# Patient Record
Sex: Female | Born: 1965 | Race: Black or African American | Hispanic: No | Marital: Married | Smoking: Never smoker
Health system: Southern US, Community
[De-identification: ages and names within clinical notes are randomized; demographics above are authoritative.]

## PROBLEM LIST (undated history)

## (undated) DIAGNOSIS — T8859XA Other complications of anesthesia, initial encounter: Secondary | ICD-10-CM

## (undated) DIAGNOSIS — E785 Hyperlipidemia, unspecified: Secondary | ICD-10-CM

## (undated) DIAGNOSIS — Z87898 Personal history of other specified conditions: Secondary | ICD-10-CM

## (undated) DIAGNOSIS — T4145XA Adverse effect of unspecified anesthetic, initial encounter: Secondary | ICD-10-CM

## (undated) DIAGNOSIS — Z9889 Other specified postprocedural states: Secondary | ICD-10-CM

## (undated) DIAGNOSIS — Z780 Asymptomatic menopausal state: Secondary | ICD-10-CM

## (undated) DIAGNOSIS — K649 Unspecified hemorrhoids: Secondary | ICD-10-CM

## (undated) DIAGNOSIS — R112 Nausea with vomiting, unspecified: Secondary | ICD-10-CM

## (undated) DIAGNOSIS — D649 Anemia, unspecified: Secondary | ICD-10-CM

## (undated) HISTORY — DX: Adverse effect of unspecified anesthetic, initial encounter: T41.45XA

## (undated) HISTORY — DX: Other specified postprocedural states: Z98.890

## (undated) HISTORY — PX: TOTAL ABDOMINAL HYSTERECTOMY: SHX209

## (undated) HISTORY — DX: Other specified postprocedural states: R11.2

## (undated) HISTORY — DX: Personal history of other specified conditions: Z87.898

## (undated) HISTORY — DX: Anemia, unspecified: D64.9

## (undated) HISTORY — DX: Unspecified hemorrhoids: K64.9

## (undated) HISTORY — DX: Asymptomatic menopausal state: Z78.0

## (undated) HISTORY — DX: Other complications of anesthesia, initial encounter: T88.59XA

## (undated) HISTORY — DX: Hyperlipidemia, unspecified: E78.5

---

## 1997-10-31 ENCOUNTER — Emergency Department (HOSPITAL_COMMUNITY): Admission: EM | Admit: 1997-10-31 | Discharge: 1997-10-31 | Payer: Self-pay | Admitting: Emergency Medicine

## 1997-10-31 ENCOUNTER — Encounter: Payer: Self-pay | Admitting: Emergency Medicine

## 1998-02-06 ENCOUNTER — Emergency Department (HOSPITAL_COMMUNITY): Admission: EM | Admit: 1998-02-06 | Discharge: 1998-02-06 | Payer: Self-pay | Admitting: Emergency Medicine

## 1998-02-06 ENCOUNTER — Encounter: Payer: Self-pay | Admitting: Emergency Medicine

## 2000-03-24 ENCOUNTER — Emergency Department (HOSPITAL_COMMUNITY): Admission: EM | Admit: 2000-03-24 | Discharge: 2000-03-24 | Payer: Self-pay | Admitting: Emergency Medicine

## 2005-11-19 ENCOUNTER — Ambulatory Visit (HOSPITAL_COMMUNITY): Admission: RE | Admit: 2005-11-19 | Discharge: 2005-11-19 | Payer: Self-pay | Admitting: Obstetrics & Gynecology

## 2006-10-18 ENCOUNTER — Emergency Department (HOSPITAL_COMMUNITY): Admission: EM | Admit: 2006-10-18 | Discharge: 2006-10-18 | Payer: Self-pay | Admitting: Emergency Medicine

## 2007-04-09 ENCOUNTER — Emergency Department (HOSPITAL_COMMUNITY): Admission: EM | Admit: 2007-04-09 | Discharge: 2007-04-09 | Payer: Self-pay | Admitting: Emergency Medicine

## 2007-05-11 ENCOUNTER — Encounter: Admission: RE | Admit: 2007-05-11 | Discharge: 2007-05-11 | Payer: Self-pay | Admitting: Obstetrics & Gynecology

## 2008-08-01 ENCOUNTER — Encounter: Admission: RE | Admit: 2008-08-01 | Discharge: 2008-08-01 | Payer: Self-pay | Admitting: Chiropractic Medicine

## 2008-08-06 ENCOUNTER — Encounter: Admission: RE | Admit: 2008-08-06 | Discharge: 2008-08-06 | Payer: Self-pay | Admitting: Obstetrics & Gynecology

## 2009-09-13 ENCOUNTER — Encounter: Admission: RE | Admit: 2009-09-13 | Discharge: 2009-09-13 | Payer: Self-pay | Admitting: Obstetrics & Gynecology

## 2010-01-26 ENCOUNTER — Encounter: Payer: Self-pay | Admitting: Obstetrics & Gynecology

## 2010-06-19 IMAGING — MG MM SCREEN MAMMOGRAM BILATERAL
4 series · 4 of 4 positions shown · non-contrast
Comparison: none

DG SCREEN MAMMOGRAM BILATERAL
Bilateral CC and MLO view(s) were taken.

DIGITAL SCREENING MAMMOGRAM WITH CAD:
The breast tissue is heterogeneously dense.  No masses or malignant type calcifications are 
identified.  Compared with prior studies.
Images were processed with CAD.

[R CC]
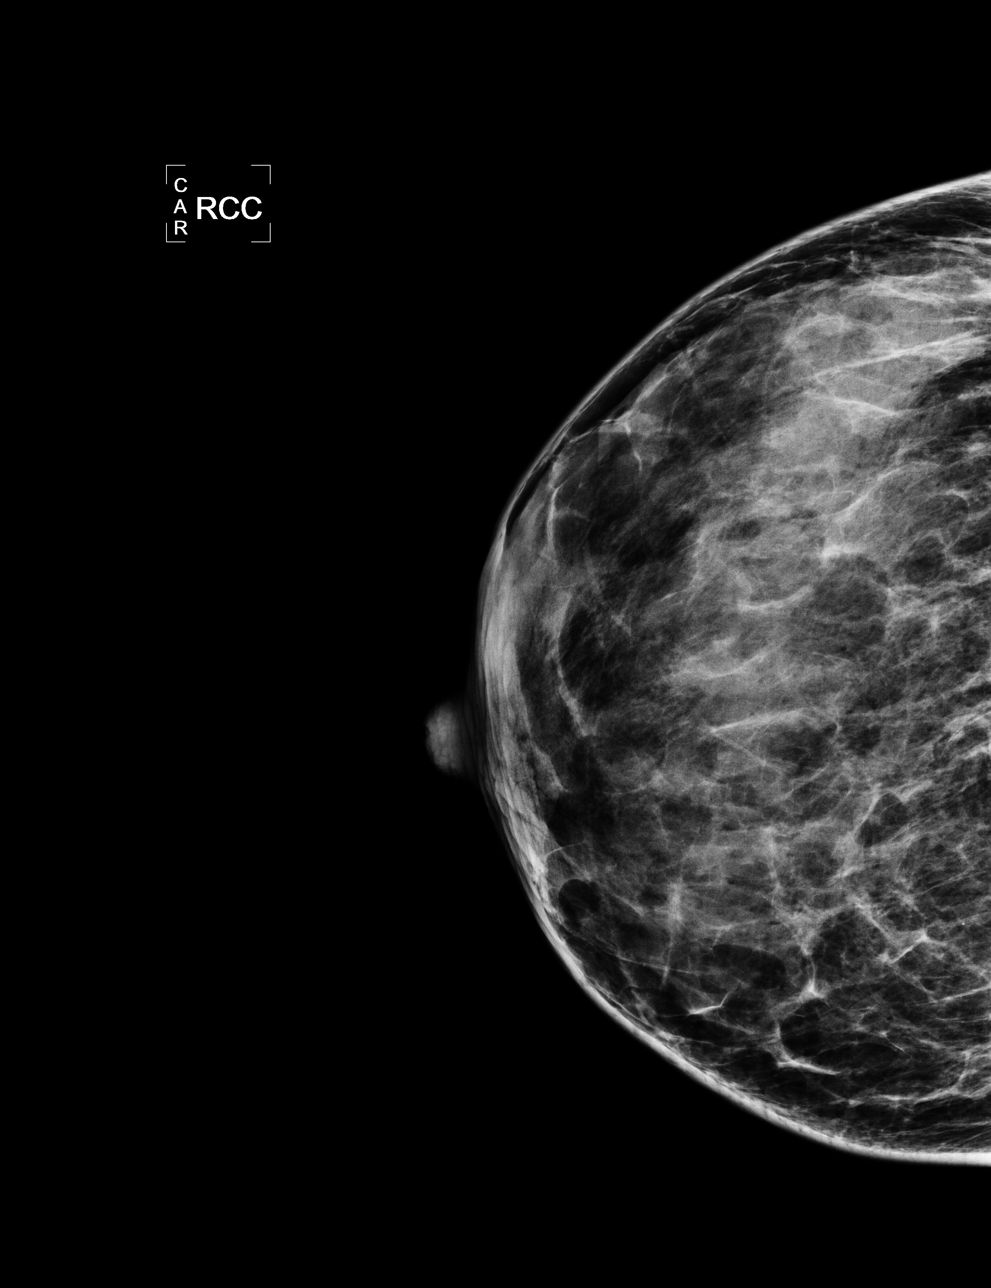

[L CC]
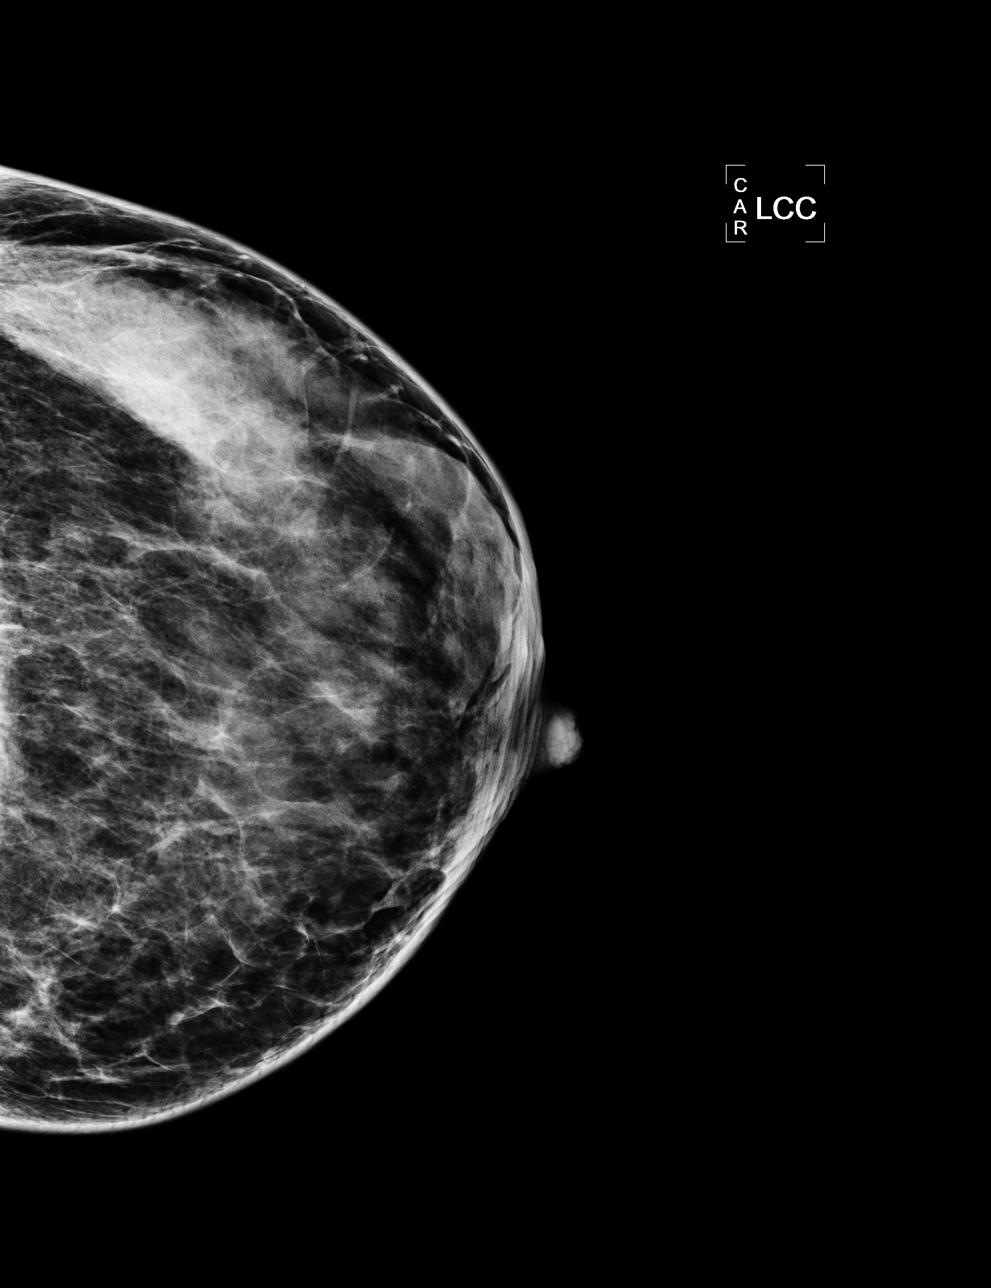

[L MLO]
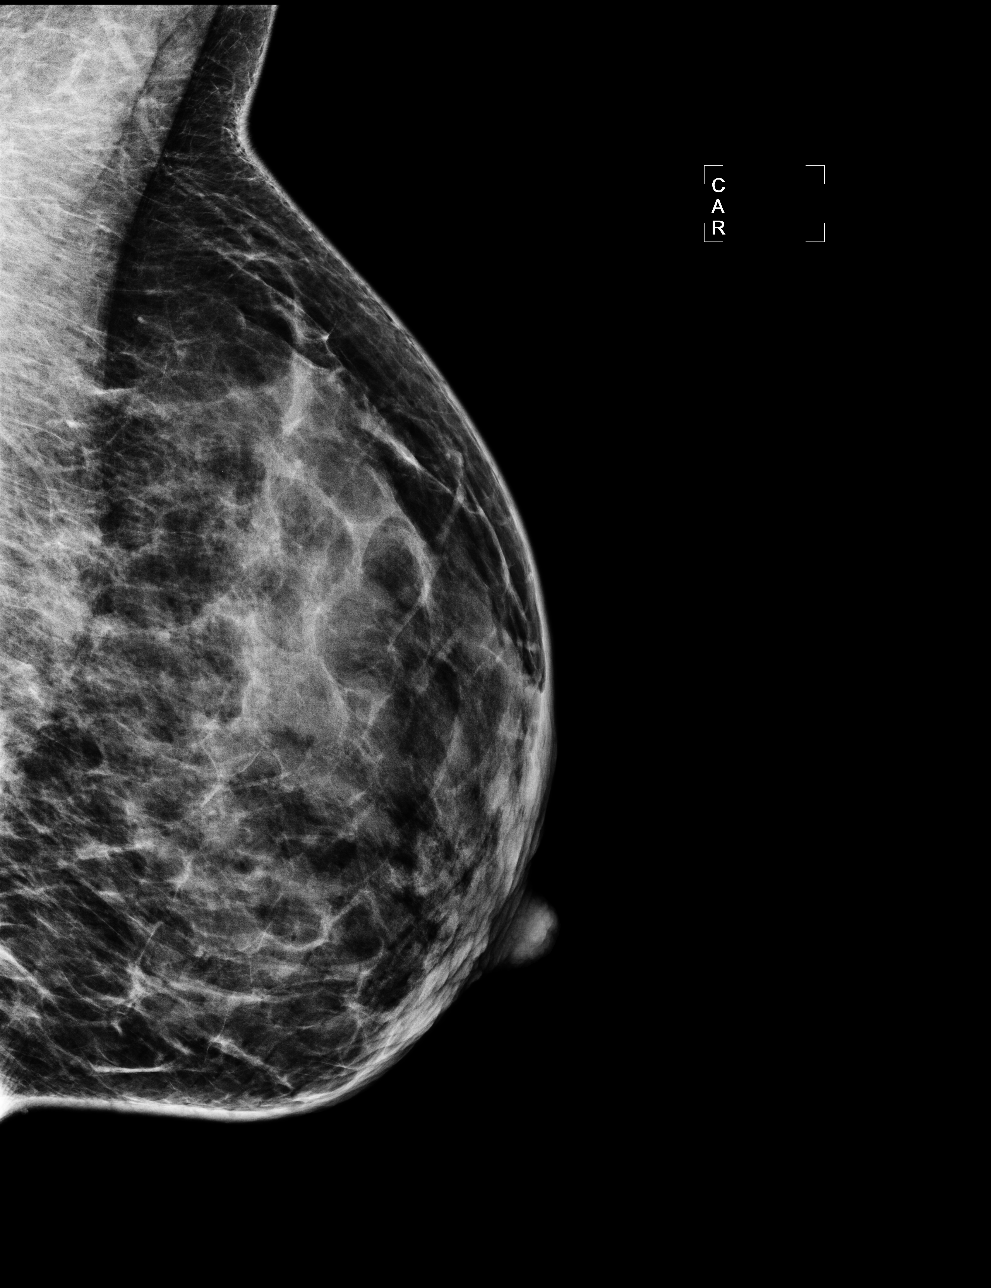

[R MLO]
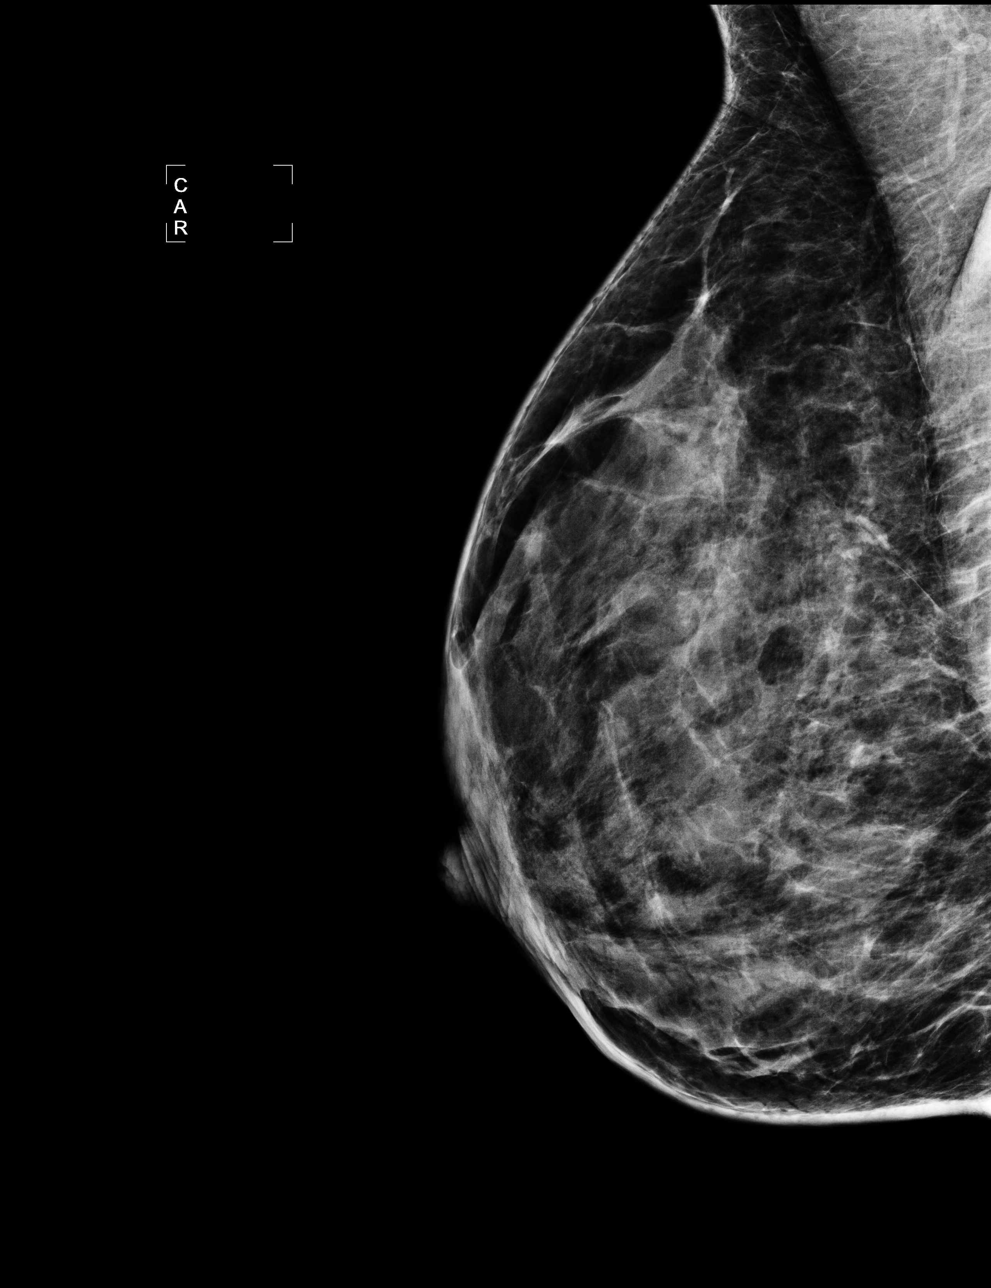

[4 of 4 positions shown; findings below may reference images not displayed]

IMPRESSION: No specific mammographic evidence of malignancy.  Next screening mammogram is recommended in one 
year.

A result letter of this screening mammogram will be mailed directly to the patient.

ASSESSMENT: Negative - BI-RADS 1

Screening mammogram in 1 year.
,

## 2010-09-30 LAB — POCT I-STAT, CHEM 8
BUN: 14
Calcium, Ion: 1.23
Chloride: 103
Glucose, Bld: 136 — ABNORMAL HIGH

## 2010-09-30 LAB — POCT CARDIAC MARKERS: Troponin i, poc: <0.05

## 2010-10-16 LAB — URINALYSIS, ROUTINE W REFLEX MICROSCOPIC
Nitrite: NEGATIVE
Specific Gravity, Urine: 1.013
pH: 7.5

## 2010-10-17 ENCOUNTER — Other Ambulatory Visit: Payer: Self-pay | Admitting: Obstetrics & Gynecology

## 2010-10-17 DIAGNOSIS — Z1231 Encounter for screening mammogram for malignant neoplasm of breast: Secondary | ICD-10-CM

## 2010-11-03 ENCOUNTER — Ambulatory Visit
Admission: RE | Admit: 2010-11-03 | Discharge: 2010-11-03 | Disposition: A | Discharge status: Home / Self Care | Payer: 59 | Source: Ambulatory Visit | Attending: Obstetrics & Gynecology | Admitting: Obstetrics & Gynecology

## 2010-11-03 DIAGNOSIS — Z1231 Encounter for screening mammogram for malignant neoplasm of breast: Secondary | ICD-10-CM

## 2011-11-19 ENCOUNTER — Other Ambulatory Visit: Payer: Self-pay | Admitting: Obstetrics & Gynecology

## 2011-11-19 DIAGNOSIS — Z1231 Encounter for screening mammogram for malignant neoplasm of breast: Secondary | ICD-10-CM

## 2012-01-01 ENCOUNTER — Ambulatory Visit
Admission: RE | Admit: 2012-01-01 | Discharge: 2012-01-01 | Disposition: A | Discharge status: Home / Self Care | Payer: 59 | Source: Ambulatory Visit | Attending: Obstetrics & Gynecology | Admitting: Obstetrics & Gynecology

## 2012-01-01 DIAGNOSIS — Z1231 Encounter for screening mammogram for malignant neoplasm of breast: Secondary | ICD-10-CM

## 2013-07-03 ENCOUNTER — Other Ambulatory Visit: Payer: Self-pay

## 2013-07-03 DIAGNOSIS — Z1231 Encounter for screening mammogram for malignant neoplasm of breast: Secondary | ICD-10-CM

## 2013-07-10 ENCOUNTER — Ambulatory Visit
Admission: RE | Admit: 2013-07-10 | Discharge: 2013-07-10 | Disposition: A | Discharge status: Home / Self Care | Payer: BC Managed Care – PPO | Source: Ambulatory Visit

## 2013-07-10 ENCOUNTER — Other Ambulatory Visit: Payer: BC Managed Care – PPO

## 2013-07-10 DIAGNOSIS — Z1231 Encounter for screening mammogram for malignant neoplasm of breast: Secondary | ICD-10-CM

## 2013-07-10 DIAGNOSIS — Z0000 Encounter for general adult medical examination without abnormal findings: Secondary | ICD-10-CM

## 2013-07-11 LAB — LIPID PANEL
CHOL/HDL RATIO: 3.1 ratio
Cholesterol: 130 mg/dL (ref 0–200)
HDL: 42 mg/dL (ref >39)
LDL CALC: 80 mg/dL (ref 0–99)
Triglycerides: 42 mg/dL (ref <150)
VLDL: 8 mg/dL (ref 0–40)

## 2013-07-11 LAB — CBC WITH DIFFERENTIAL/PLATELET
Basophils Absolute: 0 10*3/uL (ref 0.0–0.1)
Basophils Relative: 0 % (ref 0–1)
EOS ABS: 0 10*3/uL (ref 0.0–0.7)
Eosinophils Relative: 1 % (ref 0–5)
HCT: 38.1 % (ref 36.0–46.0)
HEMOGLOBIN: 13.1 g/dL (ref 12.0–15.0)
LYMPHS ABS: 1.2 10*3/uL (ref 0.7–4.0)
Lymphocytes Relative: 27 % (ref 12–46)
MCH: 28.5 pg (ref 26.0–34.0)
MCHC: 34.4 g/dL (ref 30.0–36.0)
MCV: 82.8 fL (ref 78.0–100.0)
MONOS PCT: 6 % (ref 3–12)
Monocytes Absolute: 0.3 10*3/uL (ref 0.1–1.0)
NEUTROS ABS: 3 10*3/uL (ref 1.7–7.7)
NEUTROS PCT: 66 % (ref 43–77)
PLATELETS: 171 10*3/uL (ref 150–400)
RBC: 4.6 MIL/uL (ref 3.87–5.11)
RDW: 15.1 % (ref 11.5–15.5)
WBC: 4.6 10*3/uL (ref 4.0–10.5)

## 2013-07-11 LAB — COMPLETE METABOLIC PANEL WITH GFR
ALBUMIN: 4.7 g/dL (ref 3.5–5.2)
ALK PHOS: 42 U/L (ref 39–117)
ALT: 9 U/L (ref 0–35)
AST: 13 U/L (ref 0–37)
BILIRUBIN TOTAL: 0.4 mg/dL (ref 0.2–1.2)
BUN: 9 mg/dL (ref 6–23)
CO2: 27 meq/L (ref 19–32)
Calcium: 9.3 mg/dL (ref 8.4–10.5)
Chloride: 105 mEq/L (ref 96–112)
Creat: 0.6 mg/dL (ref 0.50–1.10)
GFR, Est Non African American: >89 mL/min
GLUCOSE: 72 mg/dL (ref 70–99)
POTASSIUM: 4.1 meq/L (ref 3.5–5.3)
SODIUM: 141 meq/L (ref 135–145)
TOTAL PROTEIN: 7.3 g/dL (ref 6.0–8.3)

## 2013-07-17 ENCOUNTER — Ambulatory Visit
Admission: RE | Admit: 2013-07-17 | Discharge: 2013-07-17 | Disposition: A | Discharge status: Home / Self Care | Payer: BC Managed Care – PPO | Source: Ambulatory Visit | Attending: Physician Assistant | Admitting: Physician Assistant

## 2013-07-17 ENCOUNTER — Encounter: Payer: Self-pay | Admitting: Physician Assistant

## 2013-07-17 ENCOUNTER — Ambulatory Visit (INDEPENDENT_AMBULATORY_CARE_PROVIDER_SITE_OTHER): Payer: BC Managed Care – PPO | Admitting: Physician Assistant

## 2013-07-17 VITALS — BP 120/62 | HR 82 | Temp 97.7°F | Resp 14 | Ht 64.5 in | Wt 140.0 lb

## 2013-07-17 DIAGNOSIS — Z0000 Encounter for general adult medical examination without abnormal findings: Secondary | ICD-10-CM

## 2013-07-17 DIAGNOSIS — E559 Vitamin D deficiency, unspecified: Secondary | ICD-10-CM

## 2013-07-17 DIAGNOSIS — R1900 Intra-abdominal and pelvic swelling, mass and lump, unspecified site: Secondary | ICD-10-CM

## 2013-07-17 NOTE — Addendum Note (Signed)
Addended by: Phillips OdorSIX, CHRISTINA H on: 07/17/2013 02:56 PM   Modules accepted: Orders

## 2013-07-17 NOTE — Progress Notes (Signed)
Patient ID: Marie Wagner MRN: [id number], DOB: Jun 07, 1965, 48 y.o. Date of Encounter: 07/17/2013,   Chief Complaint: Physical (CPE)  HPI: 48 y.o. y/o AA female  here for CPE.   Her last physical exam was with me 02/05/2012.Today I have reviewed that note in her paper chart.  She says that she has noticed that her left hip area seems to be " larger than the right ".   Says she thought she should mention it is because her mom had "bone cancer". However, with further discussion she says her mom had melanoma and bone cancer--I. think that the bone cancer was probably metastasis. She states that she has had no pain in the site of her concern. With furhter discussion she actually says that she has noticed this area for many years--thinks that it's been this way since her children were born.  She works as a Chart[employer] loss adjuster. Says she's been out of school for the summer she has started to exercise. [place] park and walks/runs almost every day. Even with this exercise, she is having no pain in the left hip area.   Review of Systems: Consitutional: No fever, chills, fatigue, night sweats, lymphadenopathy. No significant/unexplained weight changes. Eyes: No visual changes, eye redness, or discharge. ENT/Mouth: No ear pain, sore throat, nasal drainage, or sinus pain. Cardiovascular: No chest pressure,heaviness, tightness or squeezing, even with exertion. No increased shortness of breath or dyspnea on exertion.No palpitations, edema, orthopnea, PND. Respiratory: No cough, hemoptysis, SOB, or wheezing. Gastrointestinal: No anorexia, dysphagia, reflux, pain, nausea, vomiting, hematemesis, diarrhea, constipation, BRBPR, or melena. Breast: No mass, nodules, bulging, or retraction. No skin changes or inflammation. No nipple discharge. No lymphadenopathy. Genitourinary: No dysuria, hematuria, incontinence, vaginal discharge, pruritis, burning, abnormal bleeding, or pain. Musculoskeletal: No  decreased ROM, No joint pain or swelling. No significant pain in neck, back, or extremities. Skin: No rash, pruritis, or concerning lesions. Neurological: No headache, dizziness, syncope, seizures, tremors, memory loss, coordination problems, or paresthesias. Psychological: No anxiety, depression, hallucinations, SI/HI. Endocrine: No polydipsia, polyphagia, polyuria, or known diabetes.No increased fatigue. No palpitations/rapid heart rate. No significant/unexplained weight change. All other systems were reviewed and are otherwise negative.  History reviewed. No pertinent past medical history.   Past Surgical History  Procedure Laterality Date  . Abdominal hysterectomy      Home Meds:  No outpatient prescriptions prior to visit.   No facility-administered medications prior to visit.    Allergies: No Known Allergies  History   Social History  . Marital Status: Married    Spouse Name: N/A    Number of Children: N/A  . Years of Education: N/A   Occupational History  . Not on file.   Social History Main Topics  . Smoking status: Never Smoker   . Smokeless tobacco: Never Used  . Alcohol Use: No  . Drug Use: No  . Sexual Activity: Yes   Other Topics Concern  . 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   She has been on summer break and started to walk/run almost every day at country [place].   Hopes to continue exercise even once the school year starts back.   Has 4 children who are "grown"    Family History  Problem Relation Age of Onset  . Cancer Mother     melanoma, "bone cancer"  . Cancer Sister 64    Breast Cancer  Physical Exam: Blood pressure 120/62, pulse 82, temperature 97.7 F (36.5 C), temperature source Oral, resp. rate 14, height 5' 4.5" (1.638 m), weight 140 lb (63.504 kg)., Body mass index is 23.67 kg/(m^2). General: Well developed, well nourished,AAF. Appears in  no acute distress. HEENT: Normocephalic, atraumatic. Conjunctiva pink, sclera non-icteric. Pupils 2 mm constricting to 1 mm, round, regular, and equally reactive to light and accomodation. EOMI. Internal auditory canal clear. TMs with good cone of light and without pathology. Nasal mucosa pink. Nares are without discharge. No sinus tenderness. Oral mucosa pink.  Pharynx without exudate.   Neck: Supple. Trachea midline. No thyromegaly. Full ROM. No lymphadenopathy.No Carotid Bruits. Lungs: Clear to auscultation bilaterally without wheezes, rales, or rhonchi. Breathing is of normal effort and unlabored. Cardiovascular: RRR with S1 S2. No murmurs, rubs, or gallops. Distal pulses 2+ symmetrically. No carotid or abdominal bruits. Breast: Per Gyn. Abdomen: Soft, non-tender, non-distended with normoactive bowel sounds. No hepatosplenomegaly or masses. No rebound/guarding. No CVA tenderness. No hernias.  Genitourinary: Per Gyn. Musculoskeletal: Full range of motion and 5/5 strength throughout. Left anterolateral thigh is more firm compared to the left, when each is palpated. However, there is no distinct mass. There is no tenderness with palpation. Extremities without  Edema. Skin: Warm and moist without erythema, ecchymosis, wounds, or rash. Neuro: A+Ox3. CN II-XII grossly intact. Moves all extremities spontaneously. Full sensation throughout. Normal gait. DTR 2+ throughout upper and lower extremities. Finger to nose intact. Psych:  Responds to questions appropriately with a normal affect.   Assessment/Plan:  48 y.o. y/o female here for CPE 1. Visit for preventive health examination  A. Screening Labs: She recently came and had screening labs on 07/10/13. All were normal and lipids were excellent.  B. Pap:per Gyn. She has a sister who had breast cancer at age 49. Patient does have annual mammograms which are followed by her gynecologist.  C. Screening Mammogram: Per Gyn  D. DEXA/BMD:  Not indicated  until age 49 90-  65  E. Colorectal Cancer Screening: Will  Discuss at age 48.  F. Immunizations:  Influenza: N/A Tetanus: Given here 01/14/2011 Pneumococcal: Not indicated until age 48 Zostavax: discuss at age 48     2. Unspecified vitamin D deficiency History a very low vitamin D level. Continue over-the-counter vitamin D 2000 units daily.  3. Mass of pelvis Discussed with her that I think that the finding on exam is just her anatomy. Also discussed that it was possible that during her pregnancies that anatomy shifted towards the left and have created this current anatomy. However I will obtain x-rays to evaluate and document. - DG Hip Bilateral W/Pelvis; Future   Signed, Shon HaleMary Beth Sea CliffDixon, [place]PA, [facility] 07/17/2013 10:58 AM

## 2014-04-18 ENCOUNTER — Encounter: Payer: Self-pay | Admitting: Family Medicine

## 2014-04-18 ENCOUNTER — Ambulatory Visit (INDEPENDENT_AMBULATORY_CARE_PROVIDER_SITE_OTHER): Payer: BC Managed Care – PPO | Admitting: Family Medicine

## 2014-04-18 VITALS — BP 118/76 | HR 78 | Temp 98.0°F | Resp 14 | Ht 65.0 in | Wt 137.0 lb

## 2014-04-18 DIAGNOSIS — R5383 Other fatigue: Secondary | ICD-10-CM

## 2014-04-18 DIAGNOSIS — E559 Vitamin D deficiency, unspecified: Secondary | ICD-10-CM | POA: Diagnosis not present

## 2014-04-18 DIAGNOSIS — J040 Acute laryngitis: Secondary | ICD-10-CM

## 2014-04-18 DIAGNOSIS — J329 Chronic sinusitis, unspecified: Secondary | ICD-10-CM

## 2014-04-18 DIAGNOSIS — R0982 Postnasal drip: Secondary | ICD-10-CM

## 2014-04-18 MED ORDER — FLUTICASONE PROPIONATE 50 MCG/ACT NA SUSP: 2.0000 | Freq: Every day | NASAL | Status: DC

## 2014-04-18 MED ORDER — METHYLPREDNISOLONE (PAK) 4 MG PO TABS: ORAL_TABLET | ORAL | Status: DC

## 2014-04-18 NOTE — Progress Notes (Signed)
Patient ID: Marie Wagner, female   DOB: Aug 15, 1965, 49 y.o.   MRN: [id number]   Subjective:    Patient ID: Marie FlemingsJudy A Wagner, female    DOB: Aug 15, 1965, 49 y.o.   MRN: [id number]  Patient presents for Had cold but hoarseness has not resolved and Fatigue   patient here with coarse voice. She had an upper respiratory infection about a month ago. She's had significant sinus drainage and postnasal drip she states she typically has some postnasal drip. She also gets mild reflux from time to time. Her symptoms of the infection cleared up and she feels fairly well besides some fatigue but she thinks this is mostly due to her job and her schedule. She sleeps well and feels rested otherwise. Her voice however has not come back and if she is still raspy and hoarse at times. She sings in the choir and this is effective she is also a Chartered loss adjusterschoolteacher. She does not have any current sore throat or sinus pressure.    Review Of Systems: per above  GEN-+ fatigue, fever, weight loss,weakness, recent illness HEENT- denies eye drainage, change in vision,+ nasal discharge, CVS- denies chest pain, palpitations RESP- denies SOB, cough, wheeze ABD- denies N/V, change in stools, abd pain GU- denies dysuria, hematuria, dribbling, incontinence MSK- denies joint pain, muscle aches, injury Neuro- denies headache, dizziness, syncope, seizure activity       Objective:    BP 118/76 mmHg  Pulse 78  Temp(Src) 98 F (36.7 C) (Oral)  Resp 14  Ht 5\' 5"  (1.651 m)  Wt 137 lb (62.143 kg)  BMI 22.80 kg/m2 GEN- NAD, alert and oriented x3 HEENT- PERRL, EOMI, non injected sclera, pink conjunctiva, MMM, , mild drainage noted post oropharynx Neck- Supple, no thyromegaly CVS- RRR, no murmur RESP-CTAB Pulses- Radial,  2+        Assessment & Plan:      Problem List Items Addressed This Visit    None    Visit Diagnoses    Other fatigue    -  Primary    Fatigue may be MTF, sleeps, well , history of Vit D def, will  recheck, also check CBC, TSH    Relevant Orders    CBC with Differential/Platelet    Comprehensive metabolic panel    TSH    Laryngitis        I think this is due to post nasal drip, I am going to give her a course of oral steroids, and nasal spray, if no improvement will send to ENT for evaluation    Vitamin D deficiency        Relevant Orders    Vitamin D, 25-hydroxy    Post-nasal drainage           Note: This dictation was prepared with Dragon dictation along with smaller phrase technology. Any transcriptional errors that result from this process are unintentional.

## 2014-04-18 NOTE — Patient Instructions (Signed)
Take steroids in morning as prescribed Use nasal spray as well for drip,  Use humidifier  Call if not improved We will call with lab results F/U as needed

## 2014-04-19 LAB — COMPREHENSIVE METABOLIC PANEL
ALT: 8 U/L (ref 0–35)
AST: 14 U/L (ref 0–37)
Albumin: 4.7 g/dL (ref 3.5–5.2)
Alkaline Phosphatase: 46 U/L (ref 39–117)
BUN: 9 mg/dL (ref 6–23)
CHLORIDE: 103 meq/L (ref 96–112)
CO2: 28 meq/L (ref 19–32)
Calcium: 9.3 mg/dL (ref 8.4–10.5)
Creat: 0.65 mg/dL (ref 0.50–1.10)
GLUCOSE: 91 mg/dL (ref 70–99)
POTASSIUM: 3.6 meq/L (ref 3.5–5.3)
Sodium: 138 mEq/L (ref 135–145)
TOTAL PROTEIN: 7.4 g/dL (ref 6.0–8.3)
Total Bilirubin: 0.4 mg/dL (ref 0.2–1.2)

## 2014-04-19 LAB — CBC WITH DIFFERENTIAL/PLATELET
BASOS PCT: 1 % (ref 0–1)
Basophils Absolute: 0.1 10*3/uL (ref 0.0–0.1)
EOS ABS: 0.1 10*3/uL (ref 0.0–0.7)
EOS PCT: 1 % (ref 0–5)
HEMATOCRIT: 38 % (ref 36.0–46.0)
HEMOGLOBIN: 12.8 g/dL (ref 12.0–15.0)
LYMPHS ABS: 1.7 10*3/uL (ref 0.7–4.0)
Lymphocytes Relative: 28 % (ref 12–46)
MCH: 27.9 pg (ref 26.0–34.0)
MCHC: 33.7 g/dL (ref 30.0–36.0)
MCV: 82.8 fL (ref 78.0–100.0)
MONO ABS: 0.4 10*3/uL (ref 0.1–1.0)
MONOS PCT: 7 % (ref 3–12)
MPV: 10.4 fL (ref 8.6–12.4)
NEUTROS PCT: 63 % (ref 43–77)
Neutro Abs: 3.7 10*3/uL (ref 1.7–7.7)
Platelets: 177 10*3/uL (ref 150–400)
RBC: 4.59 MIL/uL (ref 3.87–5.11)
RDW: 14.2 % (ref 11.5–15.5)
WBC: 5.9 10*3/uL (ref 4.0–10.5)

## 2014-04-19 LAB — TSH: TSH: 1.353 u[IU]/mL (ref 0.350–4.500)

## 2014-04-19 LAB — VITAMIN D 25 HYDROXY (VIT D DEFICIENCY, FRACTURES): VIT D 25 HYDROXY: 33 ng/mL (ref 30–100)

## 2014-04-30 ENCOUNTER — Telehealth: Payer: Self-pay | Admitting: Physician Assistant

## 2014-04-30 DIAGNOSIS — R490 Dysphonia: Secondary | ICD-10-CM

## 2014-04-30 NOTE — Telephone Encounter (Signed)
Will forward to Dr. Thiells 

## 2014-04-30 NOTE — Telephone Encounter (Signed)
[phone] Pt saw Dr Jeanice Limurham on 4/13 and was told to call back if her voice was not all the back and that she may have to be referred to a specialist and she is wanting to talk to someone about this

## 2014-04-30 NOTE — Telephone Encounter (Signed)
Please refer to ENT- for chronic hoarse voice

## 2014-04-30 NOTE — Telephone Encounter (Signed)
Call placed to patient.   Patient reports that she remains hoarse, though her voice has improved with steroid therapy.   MD please advise.

## 2014-05-01 NOTE — Telephone Encounter (Signed)
Referral order placed.   Call placed to patient and patient made aware per VM. 

## 2014-05-08 ENCOUNTER — Telehealth: Payer: Self-pay | Admitting: *Deleted

## 2014-05-08 NOTE — Telephone Encounter (Signed)
[facility] May [facility] at 1:50pm, lmtrc to pt

## 2014-05-10 NOTE — Telephone Encounter (Signed)
Called pt on cell phone lmtrc

## 2014-06-11 ENCOUNTER — Other Ambulatory Visit: Payer: Self-pay

## 2014-06-11 DIAGNOSIS — Z1231 Encounter for screening mammogram for malignant neoplasm of breast: Secondary | ICD-10-CM

## 2014-06-19 ENCOUNTER — Ambulatory Visit (INDEPENDENT_AMBULATORY_CARE_PROVIDER_SITE_OTHER): Payer: BC Managed Care – PPO | Admitting: Family Medicine

## 2014-06-19 ENCOUNTER — Encounter: Payer: Self-pay | Admitting: Family Medicine

## 2014-06-19 VITALS — BP 118/66 | HR 72 | Temp 97.9°F | Resp 14 | Ht 65.0 in | Wt 138.0 lb

## 2014-06-19 DIAGNOSIS — N644 Mastodynia: Secondary | ICD-10-CM | POA: Diagnosis not present

## 2014-06-19 DIAGNOSIS — K219 Gastro-esophageal reflux disease without esophagitis: Secondary | ICD-10-CM | POA: Diagnosis not present

## 2014-06-19 DIAGNOSIS — R0789 Other chest pain: Secondary | ICD-10-CM

## 2014-06-19 NOTE — Patient Instructions (Signed)
Try nexium once a day for symptoms Try to document what you eat before any symptoms Mammogram next month as scheduled F/U as needed

## 2014-06-20 NOTE — Progress Notes (Signed)
Patient ID: Marie Wagner, female   DOB: 1965/03/02, 49 y.o.   MRN: [id number]   Subjective:    Patient ID: Marie Wagner, female    DOB: 1965-10-16, 49 y.o.   MRN: [id number]  Patient presents for Chest Pain and Breast Sensitivity  patient here with some chest discomfort. She states that she has burning and heavy sensation when she lays down to go to sleep this is typically after eating she hasn't hasn't in the morning. His only been present for the past week. She has a lot of gurgling in her stomach and belches a lot. She took Pepto-Bismol one night that she had some non-nausea and this did help her symptoms. She denies any sharp chest pain which is radiating denies any shortness of breath or diaphoresis associated. No recent illness. She also notices a sensitivity to the left side of her left breast on and off. She has not felt any new masses or nodules she does have a mammogram scheduled for next month. This sensation will come and go and has been present for many months. She does not have a rash.    Review Of Systems:  GEN- denies fatigue, fever, weight loss,weakness, recent illness HEENT- denies eye drainage, change in vision, nasal discharge, CVS-+ chest pain, palpitations RESP- denies SOB, cough, wheeze ABD- denies N/V, change in stools, abd pain GU- denies dysuria, hematuria, dribbling, incontinence MSK- denies joint pain, muscle aches, injury Neuro- denies headache, dizziness, syncope, seizure activity       Objective:    BP 118/66 mmHg  Pulse 72  Temp(Src) 97.9 F (36.6 C) (Oral)  Resp 14  Ht 5\' 5"  (1.651 m)  Wt 138 lb (62.596 kg)  BMI 22.96 kg/m2 GEN- NAD, alert and oriented x3 HEENT- PERRL, EOMI, non injected sclera, pink conjunctiva, MMM, oropharynx clear Neck- Supple, no thyromegaly CVS- RRR, no murmur RESP-CTAB Breast- normal symmetry, no nipple inversion,no nipple drainage, no nodules or lumps felt Nodes- no axillary nodes ABD-NABS,soft,NT,ND EXT- No  edema Pulses- Radial  2+  EKG-NSR, no ST changes      Assessment & Plan:      Problem List Items Addressed This Visit    None    Visit Diagnoses    Other chest pain    -  Primary    Relevant Orders    EKG 12-Lead (Completed)    EKG 12-Lead (Completed)    Gastroesophageal reflux disease without esophagitis        Trial of nexium, non cardiac chest pain, EKG normal, recent labs normal. discused foods to avoid    Relevant Medications    esomeprazole (NEXIUM) 40 MG capsule    Breast pain        ? hormone related, minimal caffiene in diet, normal breast exam, plan for routine mammogram in a few weeks       Note: This dictation was prepared with Dragon dictation along with smaller phrase technology. Any transcriptional errors that result from this process are unintentional.

## 2014-07-12 ENCOUNTER — Ambulatory Visit
Admission: RE | Admit: 2014-07-12 | Discharge: 2014-07-12 | Disposition: A | Discharge status: Home / Self Care | Payer: BC Managed Care – PPO | Source: Ambulatory Visit

## 2014-07-12 DIAGNOSIS — Z1231 Encounter for screening mammogram for malignant neoplasm of breast: Secondary | ICD-10-CM

## 2014-10-07 ENCOUNTER — Encounter (HOSPITAL_COMMUNITY): Payer: Self-pay | Admitting: Nurse Practitioner

## 2014-10-07 ENCOUNTER — Emergency Department (HOSPITAL_COMMUNITY)
Admission: EM | Admit: 2014-10-07 | Discharge: 2014-10-07 | Disposition: A | Discharge status: Home / Self Care | Payer: BC Managed Care – PPO | Attending: Emergency Medicine | Admitting: Emergency Medicine

## 2014-10-07 DIAGNOSIS — Z79899 Other long term (current) drug therapy: Secondary | ICD-10-CM | POA: Diagnosis not present

## 2014-10-07 DIAGNOSIS — M545 Low back pain, unspecified: Secondary | ICD-10-CM

## 2014-10-07 MED ORDER — IBUPROFEN 800 MG PO TABS: 800.0000 mg | ORAL_TABLET | Freq: Three times a day (TID) | ORAL | Status: DC

## 2014-10-07 NOTE — Discharge Instructions (Signed)

## 2014-10-07 NOTE — ED Notes (Signed)
She c/o onset R mid/lower back pain onset 1 hour ago with normal activity. Pain is worse with inspiration and movement. Denies cough, fevers, n/v, bowel/bladder changes, sob, cp. A&Ox4, resp e/u

## 2014-10-07 NOTE — ED Notes (Signed)
Declined W/C at D/C and was escorted to lobby by RN. 

## 2014-10-07 NOTE — ED Provider Notes (Signed)
CSN: [id number]     Arrival date & time 10/07/14  1222 History   By signing my name below, I, Lyndel Safe, attest that this documentation has been prepared under the direction and in the presence of  Danelle Berry, PA-C. Electronically Signed: Lyndel Safe, ED Scribe. 10/07/2014. 2:01 PM.  Chief Complaint  Patient presents with  . Back Pain   The history is provided by the patient. No language interpreter was used.   HPI Comments: Marie Wagner is a 49 y.o. female who presents to the Emergency Department complaining of sudden onset, constant, sharp right mid back pain onset 1 hour ago with activity. She was worshipping at church, involving dancing, repeated sitting and standing, when she felt back pain located in her low back on the right side.  At the onset of pain she had brief associated SOB, she believes due to the pain, now resolved.  Her back pain is exacerbated with movement, palpation and deep inspiration.  She believes she pulled a muscle. She took a tylenol immediately after onset of pain and notes improvement from a 9/10 to 5/10 currently. She currently feels much improved, but at the time of onset, the pain was initially severe and she was worried so she drove straight to the ER to be evaluated.  Denies recent productive cough, wheeze, lightheadedness, abdominal pain, nausea, vomiting, dysuria, hematuria, fevers, chills, sweats, chest pain.  History reviewed. No pertinent past medical history. Past Surgical History  Procedure Laterality Date  . Abdominal hysterectomy     Family History  Problem Relation Age of Onset  . Cancer Mother     melanoma, "bone cancer"  . Cancer Sister 48    Breast Cancer   Social History  Substance Use Topics  . Smoking status: Never Smoker   . Smokeless tobacco: Never Used  . Alcohol Use: No   OB History    No data available     Review of Systems  Constitutional: Negative for fever, chills, diaphoresis, activity change and fatigue.   HENT: Negative.   Respiratory: Negative for cough, chest tightness and wheezing.   Cardiovascular: Negative for chest pain, palpitations and leg swelling.  Gastrointestinal: Negative.  Negative for vomiting, abdominal pain and diarrhea.  Genitourinary: Negative for dysuria.  Musculoskeletal: Positive for back pain. Negative for joint swelling and gait problem.  Skin: Negative.   Neurological: Negative for dizziness, tremors, seizures, syncope, weakness, light-headedness, numbness and headaches.  Psychiatric/Behavioral: Negative.    Allergies  Review of patient's allergies indicates no known allergies.  Home Medications   Prior to Admission medications   Medication Sig Start Date End Date Taking? Authorizing Provider  esomeprazole (NEXIUM) 40 MG capsule Take 40 mg by mouth daily at 12 noon.    Historical Provider, MD  ibuprofen (ADVIL,MOTRIN) 800 MG tablet Take 1 tablet (800 mg total) by mouth 3 (three) times daily. 10/07/14   Danelle Berry, PA-C   BP 148/89 mmHg  Pulse 80  Temp(Src) 97.5 F (36.4 C) (Oral)  Resp 16  Ht  (1.651 m)  Wt 140 lb 9.6 oz (63.776 kg)  BMI 23.40 kg/m2  SpO2 100% Physical Exam  Constitutional: She is oriented to person, place, and time. She appears well-developed and well-nourished. No distress.  HENT:  Head: Normocephalic and atraumatic.  Right Ear: External ear normal.  Left Ear: External ear normal.  Nose: Nose normal.  Mouth/Throat: Oropharynx is clear and moist. No oropharyngeal exudate.  Eyes: Conjunctivae and EOM are normal. Pupils are equal, round,  and reactive to light. Right eye exhibits no discharge. Left eye exhibits no discharge. No scleral icterus.  Neck: Normal range of motion. Neck supple. No JVD present. No tracheal deviation present.  Cardiovascular: Normal rate, regular rhythm, normal heart sounds and intact distal pulses.  Exam reveals no gallop and no friction rub.   No murmur heard. Pulmonary/Chest: Effort normal and breath  sounds normal. No stridor. No respiratory distress. She has no wheezes. She has no rales. She exhibits no tenderness.  Abdominal: Soft. Bowel sounds are normal. She exhibits no distension. There is no tenderness.  Musculoskeletal: Normal range of motion. She exhibits tenderness. She exhibits no edema.       Arms: Back, neck and hips normal ROM, no bony ttp ttp to right low back, no SI ttp, no IT band ttp Normal strength of LE bilaterally, normal dorsiflexion/plantar flexion Normal sensation to light touch throughout   Lymphadenopathy:    She has no cervical adenopathy.  Neurological: She is alert and oriented to person, place, and time. She exhibits normal muscle tone. Coordination normal.  Skin: Skin is warm and dry. No rash noted. She is not diaphoretic. No erythema. No pallor.  Psychiatric: She has a normal mood and affect. Her behavior is normal. Judgment and thought content normal.  Nursing note and vitals reviewed.   ED Course  Procedures  DIAGNOSTIC STUDIES: Oxygen Saturation is 100% on RA, normal by my interpretation.    COORDINATION OF CARE: 1:51 PM Discussed treatment plan with pt at bedside and pt agreed to plan. Will prescribe NSAIDs.   MDM   Final diagnoses:  Right-sided chest wall pain    Low right back pain - consistent with acute muscle strain, no concern for kidney or lung pathology, no constitutional sx, pain reproducible with movement and palpation  D/C home with NSAID.  Pt was comfortable with plan to d/c home.  She had significant improvement of pain with self-administered tylenol.  I personally performed the services described in this documentation, which was scribed in my presence. The recorded information has been reviewed and is accurate.    Danelle Berry, PA-C 10/09/14 1121  Gwyneth Sprout, MD 10/11/14 [phone]

## 2015-02-28 ENCOUNTER — Encounter: Payer: Self-pay | Admitting: Family Medicine

## 2015-02-28 ENCOUNTER — Ambulatory Visit (INDEPENDENT_AMBULATORY_CARE_PROVIDER_SITE_OTHER): Payer: BC Managed Care – PPO | Admitting: Physician Assistant

## 2015-02-28 ENCOUNTER — Encounter: Payer: Self-pay | Admitting: Physician Assistant

## 2015-02-28 VITALS — BP 112/80 | HR 68 | Temp 98.3°F | Resp 18 | Wt 142.0 lb

## 2015-02-28 DIAGNOSIS — J988 Other specified respiratory disorders: Secondary | ICD-10-CM

## 2015-02-28 DIAGNOSIS — B349 Viral infection, unspecified: Secondary | ICD-10-CM | POA: Diagnosis not present

## 2015-02-28 DIAGNOSIS — R509 Fever, unspecified: Secondary | ICD-10-CM

## 2015-02-28 DIAGNOSIS — R102 Pelvic and perineal pain: Secondary | ICD-10-CM

## 2015-02-28 DIAGNOSIS — B9789 Other viral agents as the cause of diseases classified elsewhere: Secondary | ICD-10-CM

## 2015-02-28 LAB — URINALYSIS, ROUTINE W REFLEX MICROSCOPIC
BILIRUBIN URINE: NEGATIVE
GLUCOSE, UA: NEGATIVE
HGB URINE DIPSTICK: NEGATIVE
Ketones, ur: NEGATIVE
LEUKOCYTES UA: NEGATIVE
Nitrite: NEGATIVE
PROTEIN: NEGATIVE
Specific Gravity, Urine: 1.02 (ref 1.001–1.035)
pH: 7 (ref 5.0–8.0)

## 2015-02-28 LAB — INFLUENZA A AND B AG, IMMUNOASSAY
INFLUENZA A ANTIGEN: NOT DETECTED
INFLUENZA B ANTIGEN: NOT DETECTED

## 2015-02-28 NOTE — Progress Notes (Signed)
Patient ID: Marie Wagner MRN: [id number], DOB: 11/02/1965, 50 y.o. Date of Encounter: 02/28/2015, 4:35 PM    Chief Complaint:  Chief Complaint  Patient presents with  . sick x 1 week    cough,cold, fever/chills  . c/o UTI     HPI: 50 y.o. year old AA female presents with above.   She states that the congestion symptoms started around Monday or Tuesday which was the 20th or 21st. Says that she has had chest congestion and cough a little sore throat and a little congestion in her head and nose. No fever.  Also states that at times right near her belly button will feel sensitive to touch right at the surface. Says that this happens intermittently and then resolves. Says it is never a deep pain but just right at the surface at the skin.  No other complaints or concerns.     Home Meds:   Outpatient Prescriptions Prior to Visit  Medication Sig Dispense Refill  . esomeprazole (NEXIUM) 40 MG capsule Take 40 mg by mouth daily at 12 noon. Reported on 02/28/2015    . ibuprofen (ADVIL,MOTRIN) 800 MG tablet Take 1 tablet (800 mg total) by mouth 3 (three) times daily. (Patient not taking: Reported on 02/28/2015) 21 tablet 0   No facility-administered medications prior to visit.    Allergies: No Known Allergies    Review of Systems: See HPI for pertinent ROS. All other ROS negative.    Physical Exam: Blood pressure 112/80, pulse 68, temperature 98.3 F (36.8 C), temperature source Oral, resp. rate 18, weight 142 lb (64.411 kg)., Body mass index is 23.63 kg/(m^2). General:  WNWD AAF. Appears in no acute distress. HEENT: Normocephalic, atraumatic, eyes without discharge, sclera non-icteric, nares are without discharge. Bilateral auditory canals clear, TM's are without perforation, pearly grey and translucent with reflective cone of light bilaterally. Oral cavity moist, posterior pharynx without exudate, erythema, peritonsillar abscess.  Neck: Supple. No thyromegaly. No  lymphadenopathy. Lungs: Clear bilaterally to auscultation without wheezes, rales, or rhonchi. Breathing is unlabored. Heart: Regular rhythm. No murmurs, rubs, or gallops. Abdomen: Soft, non-tender, non-distended with normoactive bowel sounds. No hepatomegaly. No rebound/guarding. No obvious abdominal masses. There is no tenderness with palpation of the abdomen. There is no superficial tenderness or sensitivity at the present time. Msk:  Strength and tone normal for age. Extremities/Skin: Warm and dry.  Neuro: Alert and oriented X 3. Moves all extremities spontaneously. Gait is normal. CNII-XII grossly in tact. Psych:  Responds to questions appropriately with a normal affect.   Results for orders placed or performed in visit on 02/28/15  Urinalysis, Routine w reflex microscopic (not at [facility])  Result Value Ref Range   Color, Urine YELLOW YELLOW   APPearance CLEAR CLEAR   Specific Gravity, Urine 1.020 1.001 - 1.035   pH 7.0 5.0 - 8.0   Glucose, UA NEGATIVE NEGATIVE   Bilirubin Urine NEGATIVE NEGATIVE   Ketones, ur NEGATIVE NEGATIVE   Hgb urine dipstick NEGATIVE NEGATIVE   Protein, ur NEGATIVE NEGATIVE   Nitrite NEGATIVE NEGATIVE   Leukocytes, UA NEGATIVE NEGATIVE  Influenza A and B Ag, Immunoassay  Result Value Ref Range   Source: NASAL    Influenza A Antigen Not Detected Not Detected   Influenza B Antigen Not Detected Not Detected     ASSESSMENT AND PLAN:  50 y.o. year old female with  1. Viral respiratory infection She can use over-the-counter medications as needed for symptom relief. Follow-up if symptoms persist greater  than 7 days.  2. Fever and chills - Influenza A and B Ag, Immunoassay  3. Pelvic pain in female - Urinalysis, Routine w reflex microscopic (not at [facility]) Discussed with patient that given the superficial nature of the sensitivity she experiences intermittently, suspect that this is secondary to her belt or her button of her pants pressing on this area. Today  she is wearing a belt that extends up above her pants waist line and such a belt would press on that umbilical area when she is sitting up.  [address] [place], [place], [facility] 02/28/2015 4:35 PM

## 2015-05-21 ENCOUNTER — Telehealth: Payer: Self-pay | Admitting: Physician Assistant

## 2015-05-21 ENCOUNTER — Encounter: Payer: Self-pay | Admitting: Physician Assistant

## 2015-05-21 ENCOUNTER — Ambulatory Visit (INDEPENDENT_AMBULATORY_CARE_PROVIDER_SITE_OTHER): Payer: BC Managed Care – PPO | Admitting: Family Medicine

## 2015-05-21 VITALS — BP 110/70 | HR 68 | Temp 97.5°F | Resp 18 | Wt 141.0 lb

## 2015-05-21 DIAGNOSIS — M436 Torticollis: Secondary | ICD-10-CM

## 2015-05-21 MED ORDER — DIAZEPAM 10 MG PO TABS: 10.0000 mg | ORAL_TABLET | Freq: Three times a day (TID) | ORAL | Status: DC | PRN

## 2015-05-21 MED ORDER — PREDNISONE 20 MG PO TABS: ORAL_TABLET | ORAL | Status: DC

## 2015-05-21 NOTE — Progress Notes (Signed)
   Subjective:    Patient ID: Marie Wagner, female    DOB: [birth year], 50 y.o.   MRN: [id number]  HPI  Patient has a history of getting cricks in her neck. This is gone on for years. They tend to last 1 or 2 days and resolve spontaneously however over the last 4 weeks she has had a constant pain in her neck from the base of her occiput down the right side of the trapezius muscle into the right shoulder blade. It tends to come and go. It will not release. It will not relax. She denies any numbness or tingling in her hands. She denies any weakness in her hands. She denies any neuropathy in her arm. Muscle strength is 5 over 5 equal and symmetric in both arms and hands. She has normal reflexes in her right arm. She denies any injuries to the neck. She is reportedly seen a chiropractor who performed an x-ray of the neck which was reportedly normal with no evidence of degenerative disc disease or arthritis. No past medical history on file. Past Surgical History  Procedure Laterality Date  . Abdominal hysterectomy     Current Outpatient Prescriptions on File Prior to Visit  Medication Sig Dispense Refill  . ibuprofen (ADVIL,MOTRIN) 800 MG tablet Take 1 tablet (800 mg total) by mouth 3 (three) times daily. 21 tablet 0  . esomeprazole (NEXIUM) 40 MG capsule Take 40 mg by mouth daily at 12 noon. Reported on 05/21/2015     No current facility-administered medications on file prior to visit.   No Known Allergies Social History   Social History  . Marital Status: Married    Spouse Name: N/A  . Number of Children: N/A  . Years of Education: N/A   Occupational History  . Not on file.   Social History Main Topics  . Smoking status: Never Smoker   . Smokeless tobacco: Never Used  . Alcohol Use: No  . Drug Use: No  . Sexual Activity: Yes   Other Topics Concern  . 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     She has been on summer break and started to walk[place]run almost every day [place].   Hopes to continue exercise even once the school year starts back.   Has 4 children who are "grown"     Review of Systems  All other systems reviewed and are negative.      Objective:   Physical Exam  Cardiovascular: Normal rate, regular rhythm and normal heart sounds.   Pulmonary/Chest: Effort normal and breath sounds normal. No respiratory distress. She has no wheezes. She has no rales.  Musculoskeletal:       Cervical back: She exhibits decreased range of motion, tenderness and pain. She exhibits no bony tenderness and no spasm.  Vitals reviewed.         Assessment & Plan:  Torticollis, acute - Plan: predniSONE (DELTASONE) 20 MG tablet, diazepam (VALIUM) 10 MG tablet, Ambulatory referral to Physical Therapy  I believe this is all cervical muscle strain and spasm. Begin Valium 5-10 mg every 8 hours as needed for muscle spasm. Begin prednisone taper pack. Recommended moist heat. Consult physical therapy given the fact it has gone on for 4 weeks and is getting worse.

## 2015-05-21 NOTE — Telephone Encounter (Signed)
Pt called to see if Dr. Tanya NonesPickard wants her to stop her chiropractic visits. Please advise [phone]

## 2015-05-23 NOTE — Telephone Encounter (Signed)
If no better, I would recommend PT instead of chiropracter.

## 2015-05-28 NOTE — Telephone Encounter (Signed)
Pt aware referral sent  

## 2015-05-30 IMAGING — CR DG HIP COMPLETE 2+V*R*
2 series · 2 of 2 positions shown · non-contrast
Comparison: None.

CLINICAL DATA: Bilateral hip pain

EXAM:
RIGHT HIP - COMPLETE 2+ VIEW

[t hip ap right]
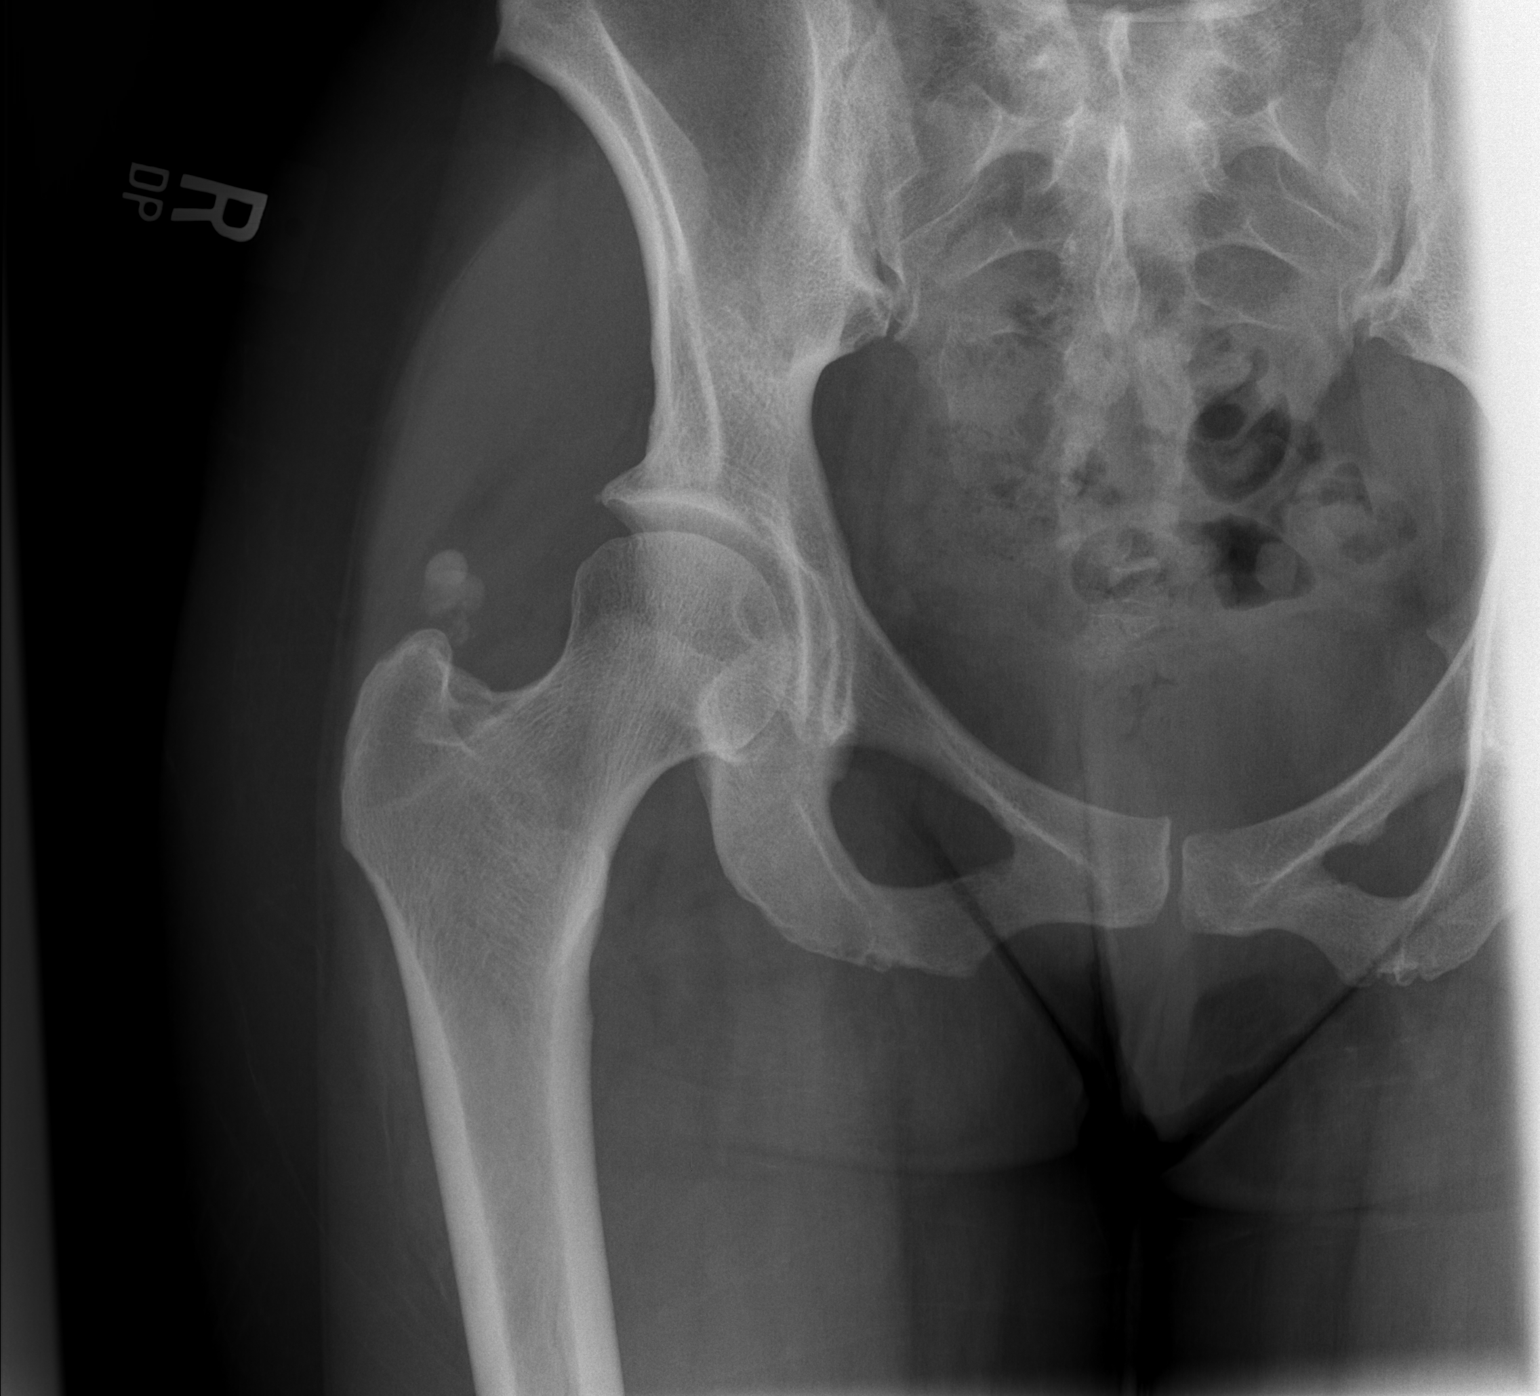

[t hip frog leg right]
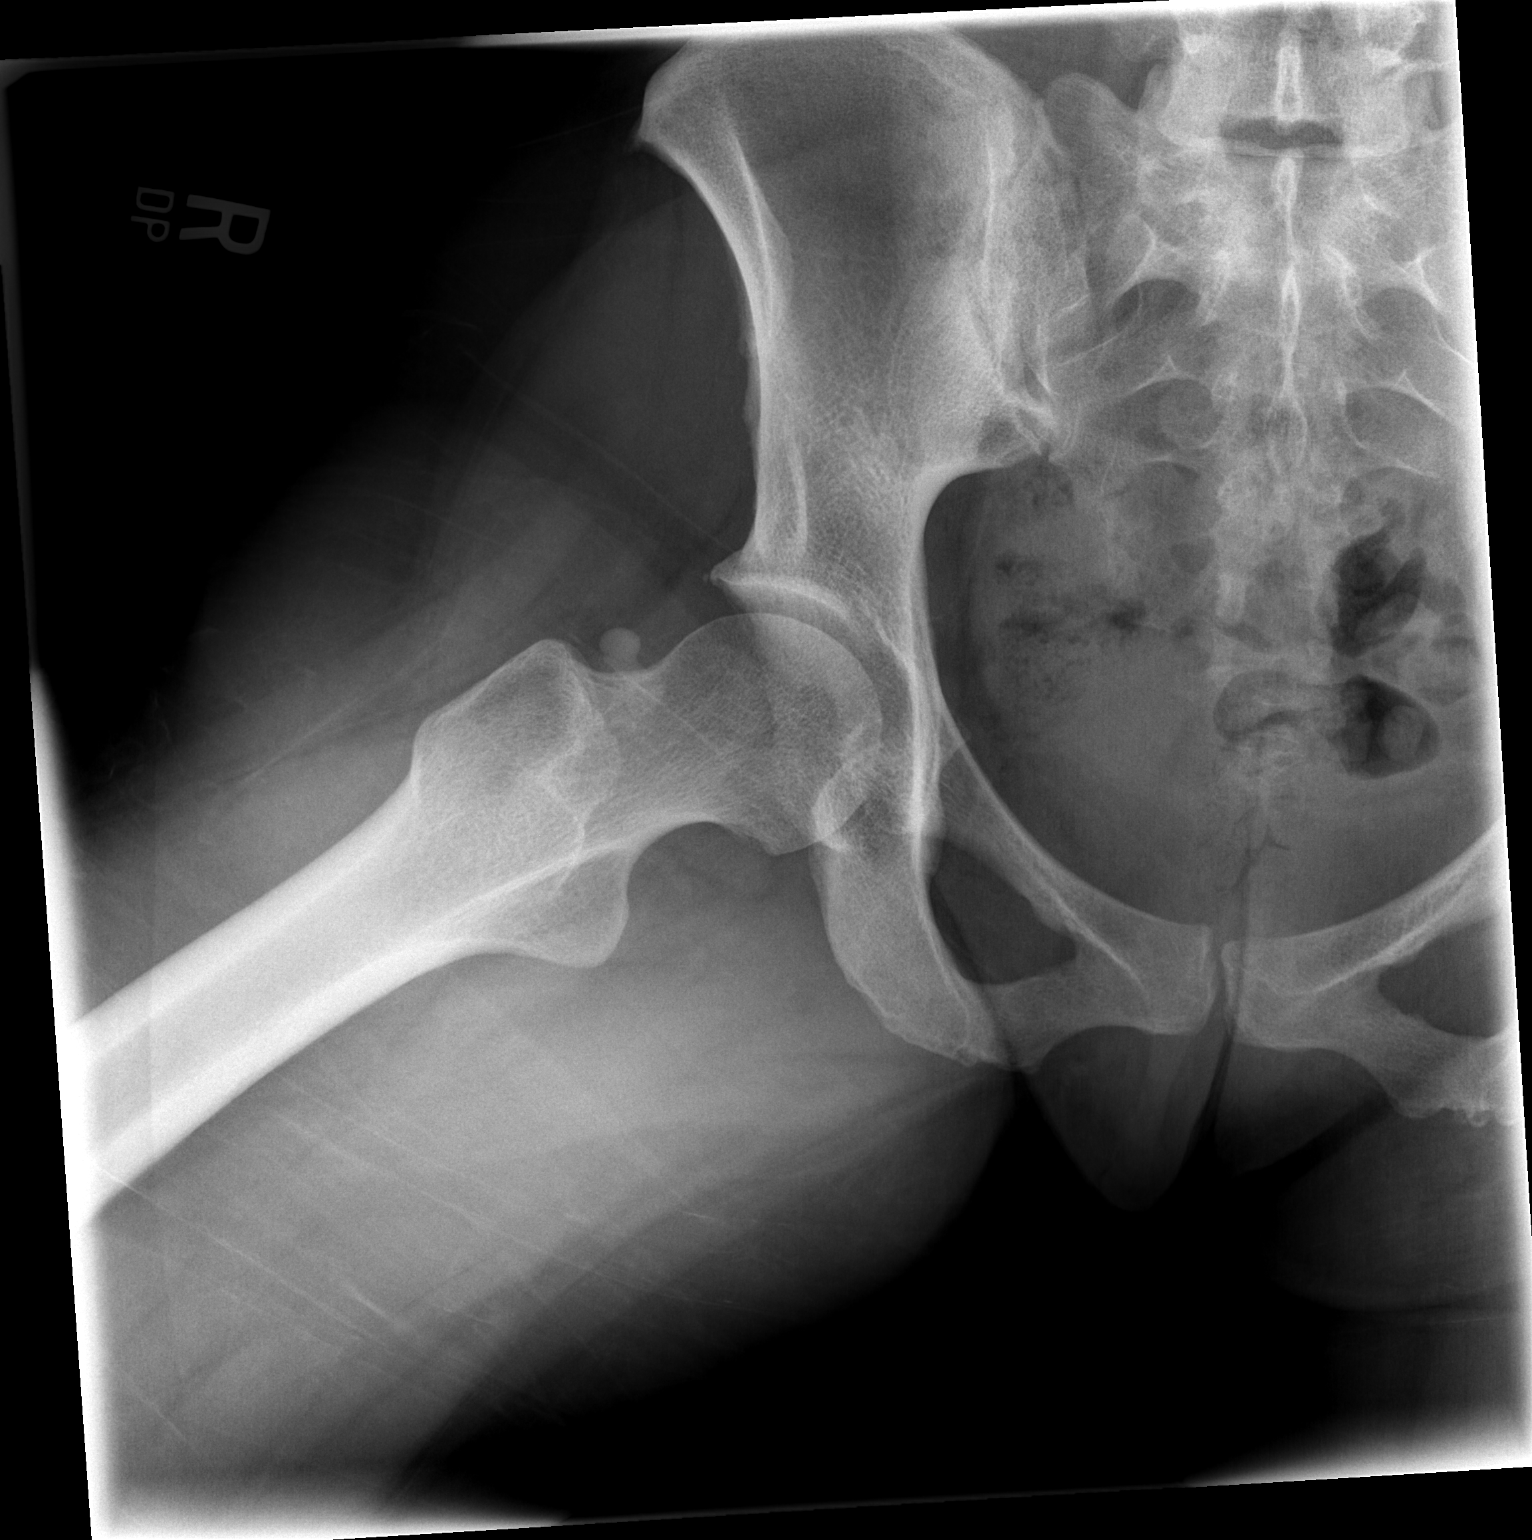

[2 of 2 positions shown; findings below may reference images not displayed]

FINDINGS: There is no evidence of hip fracture or dislocation. There is no
evidence of arthropathy or other focal bone abnormality. There is
heterotopic ossification adjacent to the superior aspect of the
greater trochanter.
IMPRESSION: No acute osseous injury of the right hip.

## 2015-05-30 IMAGING — CR DG HIP (WITH OR WITHOUT PELVIS) 2-3V*L*
2 series · 2 of 2 positions shown · non-contrast
Comparison: None.

CLINICAL DATA: Bilateral hip pain

EXAM:
LEFT HIP - COMPLETE 2+ VIEW

[t hip ap left]
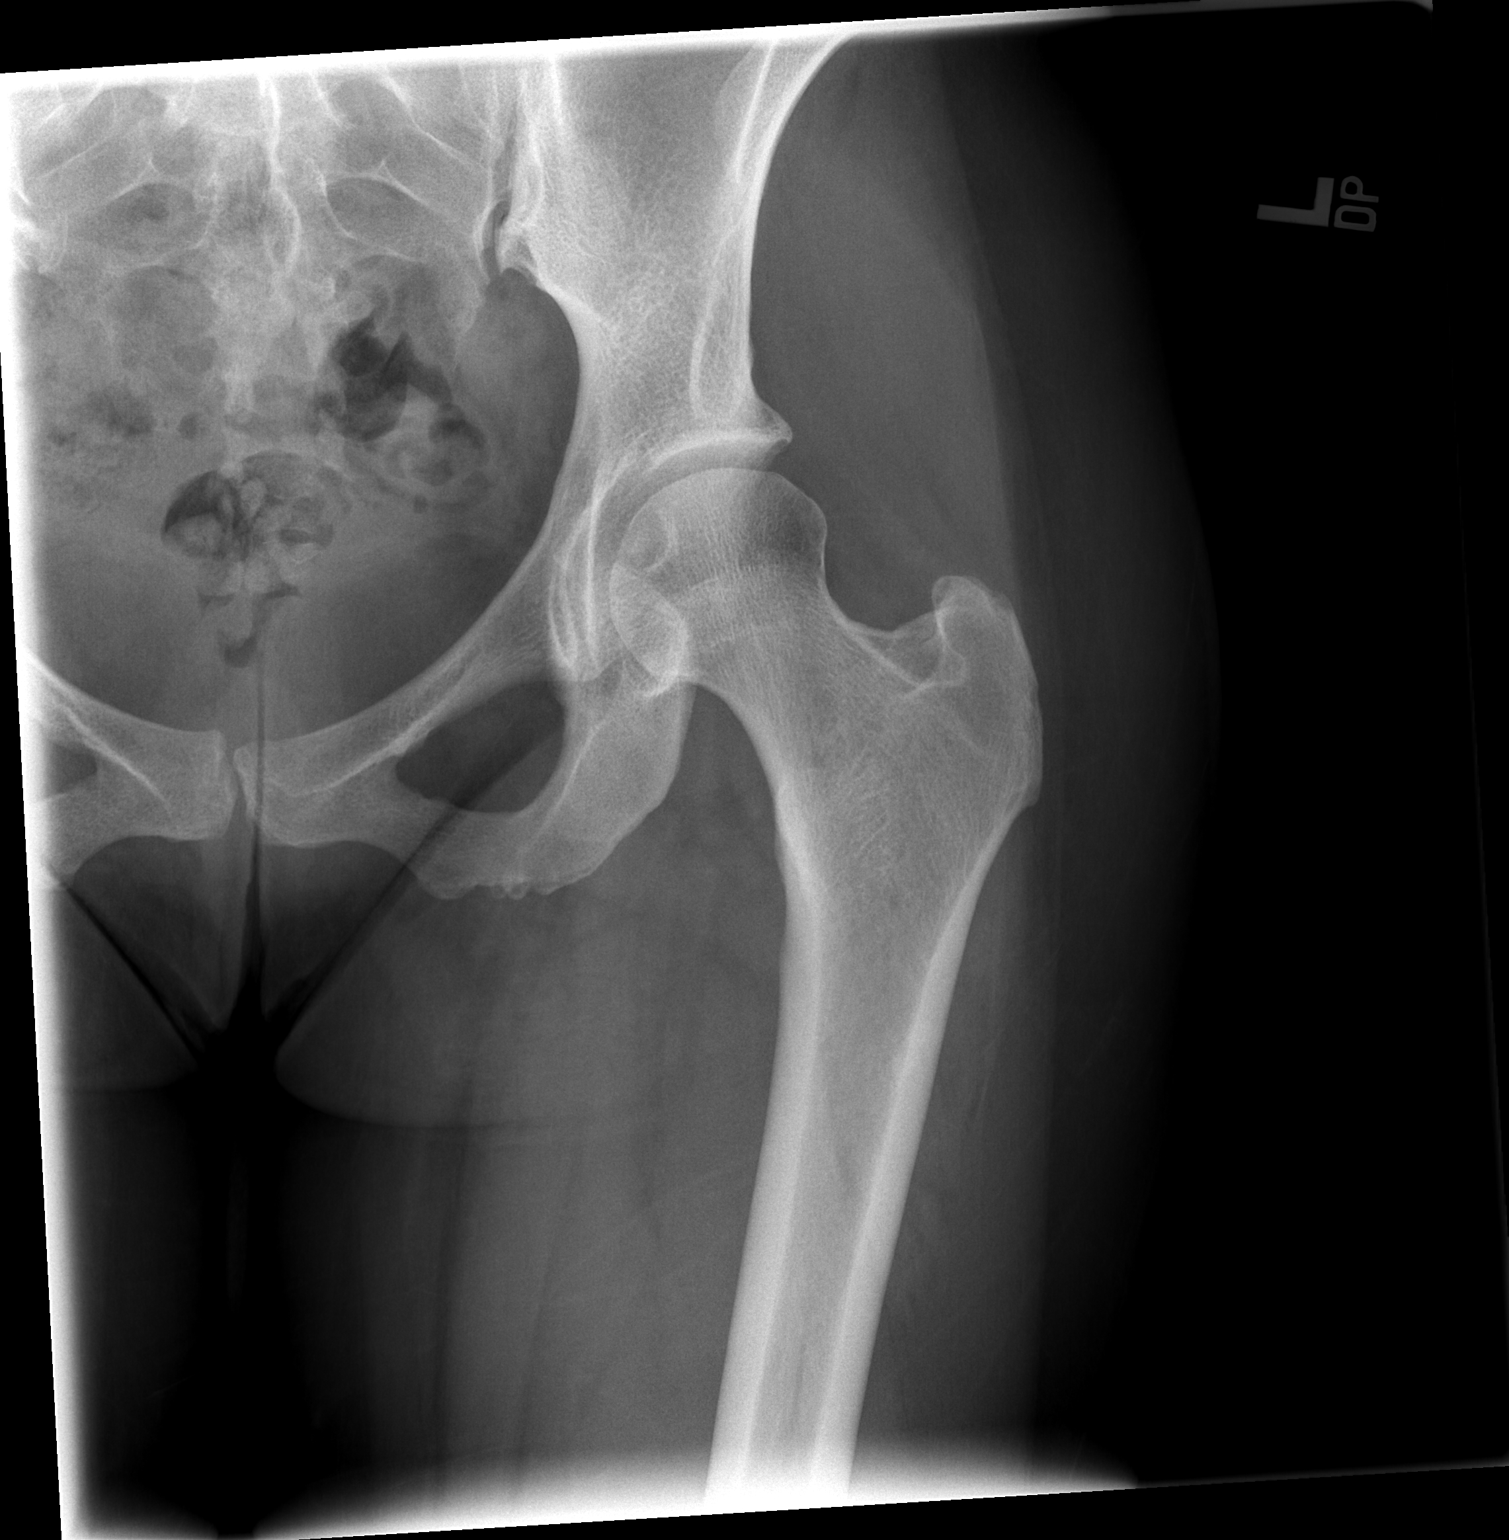

[t hip frog leg left]
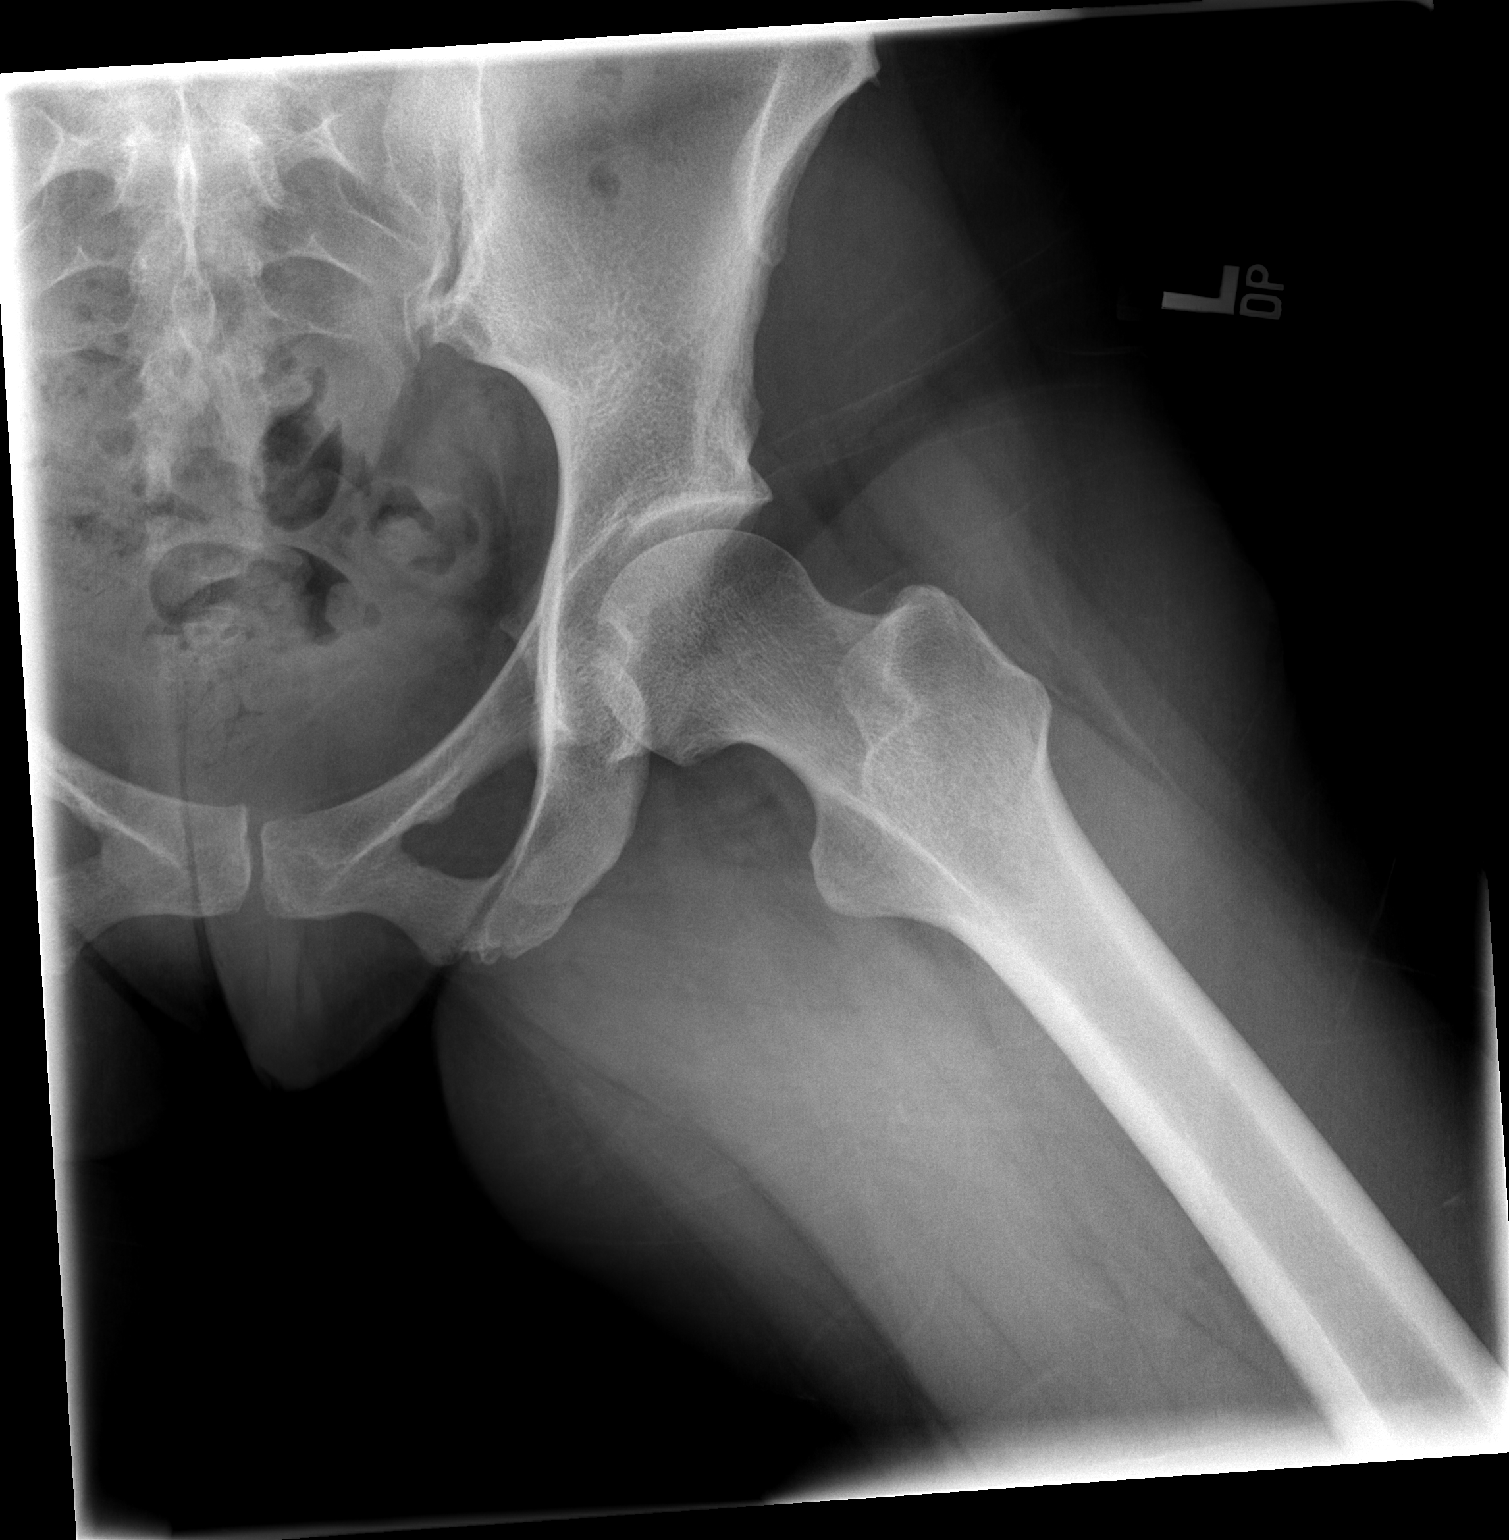

[2 of 2 positions shown; findings below may reference images not displayed]

FINDINGS: There is no evidence of hip fracture or dislocation. There is no
evidence of arthropathy or other focal bone abnormality.
IMPRESSION: Negative.

## 2015-07-10 ENCOUNTER — Other Ambulatory Visit: Payer: Self-pay | Admitting: Family Medicine

## 2015-07-10 DIAGNOSIS — Z1231 Encounter for screening mammogram for malignant neoplasm of breast: Secondary | ICD-10-CM

## 2015-07-15 ENCOUNTER — Ambulatory Visit: Payer: BC Managed Care – PPO | Admitting: Physical Therapy

## 2015-07-16 ENCOUNTER — Ambulatory Visit
Admission: RE | Admit: 2015-07-16 | Discharge: 2015-07-16 | Disposition: A | Discharge status: Home / Self Care | Payer: BC Managed Care – PPO | Source: Ambulatory Visit | Attending: Family Medicine | Admitting: Family Medicine

## 2015-07-16 DIAGNOSIS — Z1231 Encounter for screening mammogram for malignant neoplasm of breast: Secondary | ICD-10-CM

## 2015-07-17 ENCOUNTER — Encounter: Payer: BC Managed Care – PPO | Admitting: Physical Therapy

## 2015-07-23 ENCOUNTER — Ambulatory Visit: Payer: BC Managed Care – PPO | Admitting: Physical Therapy

## 2015-07-25 ENCOUNTER — Encounter: Payer: BC Managed Care – PPO | Admitting: Physical Therapy

## 2015-07-30 ENCOUNTER — Encounter: Payer: BC Managed Care – PPO | Admitting: Physical Therapy

## 2015-08-01 ENCOUNTER — Encounter: Payer: BC Managed Care – PPO | Admitting: Physical Therapy

## 2015-08-06 ENCOUNTER — Encounter: Payer: BC Managed Care – PPO | Admitting: Physical Therapy

## 2015-08-08 ENCOUNTER — Encounter: Payer: BC Managed Care – PPO | Admitting: Physical Therapy

## 2015-08-13 ENCOUNTER — Ambulatory Visit: Payer: BC Managed Care – PPO

## 2015-08-15 ENCOUNTER — Ambulatory Visit: Payer: BC Managed Care – PPO

## 2015-10-28 ENCOUNTER — Ambulatory Visit: Payer: BC Managed Care – PPO | Admitting: Physician Assistant

## 2015-10-30 ENCOUNTER — Ambulatory Visit (INDEPENDENT_AMBULATORY_CARE_PROVIDER_SITE_OTHER): Payer: BC Managed Care – PPO | Admitting: Family Medicine

## 2015-10-30 ENCOUNTER — Encounter: Payer: Self-pay | Admitting: Family Medicine

## 2015-10-30 VITALS — BP 122/62 | HR 66 | Temp 99.6°F | Resp 14 | Ht 65.0 in | Wt 138.0 lb

## 2015-10-30 DIAGNOSIS — J0100 Acute maxillary sinusitis, unspecified: Secondary | ICD-10-CM

## 2015-10-30 MED ORDER — AMOXICILLIN 875 MG PO TABS: 875.0000 mg | ORAL_TABLET | Freq: Two times a day (BID) | ORAL | 0 refills | Status: DC

## 2015-10-30 NOTE — Progress Notes (Signed)
   Subjective:    Patient ID: Marie Wagner, female    DOB: 1965/05/01, 50 y.o.   MRN: [id number]  Patient presents for Illness (x10 day- post nasal drip, ear pressure, HA, nasal drainage with yellow to green colored mucus)  Patient here with sinus pressure or drainage worsen over the past 10 days. She received ibuprofen and TheraFlu which helps some. She feels like she is mostly getting postnasal drip at night the cough syrup to cough some. She still has significant sinus pressure that does not seem to be letting up as well as some ear pain. She has not had any fever. She is a nonsmoker. She is not on any regular medications.    Review Of Systems:  GEN- denies fatigue, fever, weight loss,weakness, recent illness HEENT- denies eye drainage, change in vision, +nasal discharge, CVS- denies chest pain, palpitations RESP- denies SOB, +cough, wheeze ABD- denies N/V, change in stools, abd pain Neuro-+headache, denies dizziness, syncope, seizure activity       Objective:    BP 122/62 (BP Location: Left Arm, Patient Position: Sitting, Cuff Size: Normal)   Pulse 66   Temp 99.6 F (37.6 C) (Oral)   Resp 14   Ht 5\' 5"  (1.651 m)   Wt 138 lb (62.6 kg)   SpO2 99%   BMI 22.96 kg/m  GEN- NAD, alert and oriented x3 HEENT- PERRL, EOMI, non injected sclera, pink conjunctiva, MMM, oropharynx mild injection, TM clear bilat no effusion,  + maxillary sinus tenderness, inflammed turbinates,  Nasal drainage  Neck- Supple, small shotty submandibular  LAD CVS- RRR, no murmur RESP-CTAB Pulses- Radial 2+          Assessment & Plan:      Problem List Items Addressed This Visit    None    Visit Diagnoses    Acute non-recurrent maxillary sinusitis    -  Primary   treat with amox, add sudafed OTC, nasal saline or flonase   Relevant Medications   amoxicillin (AMOXIL) 875 MG tablet      Note: This dictation was prepared with Dragon dictation along with smaller phrase technology. Any  transcriptional errors that result from this process are unintentional.

## 2015-10-30 NOTE — Patient Instructions (Signed)
F/U as needed

## 2016-05-20 ENCOUNTER — Encounter: Payer: Self-pay | Admitting: Family Medicine

## 2016-05-20 ENCOUNTER — Ambulatory Visit (INDEPENDENT_AMBULATORY_CARE_PROVIDER_SITE_OTHER): Payer: BC Managed Care – PPO | Admitting: Physician Assistant

## 2016-05-20 ENCOUNTER — Encounter: Payer: Self-pay | Admitting: Physician Assistant

## 2016-05-20 VITALS — BP 110/80 | HR 69 | Temp 97.9°F | Resp 14 | Wt 142.4 lb

## 2016-05-20 DIAGNOSIS — L309 Dermatitis, unspecified: Secondary | ICD-10-CM

## 2016-05-20 MED ORDER — CLOTRIMAZOLE-BETAMETHASONE 1-0.05 % EX CREA: 1.0000 "application " | TOPICAL_CREAM | Freq: Two times a day (BID) | CUTANEOUS | 0 refills | Status: DC

## 2016-05-20 NOTE — Progress Notes (Signed)
    Patient ID: Marie FlemingsJudy A Klipfel MRN: [id number], DOB: [birth year], 51 y.o. Date of Encounter: 05/20/2016, 8:30 AM    Chief Complaint:  Chief Complaint  Patient presents with  . Rash    lower right side     HPI: 51 y.o. year old female presents with above.   Says that she noticed this area of rash 2 or 3 weeks ago. It is located on her right flank directly inferior to axillae but just below  the level of bra. She states that she has seen no other area of rash. She states that she has used some Lotrimin and also some cortisone. Says that they seemed to help decrease the itch some but the rash never resolved. Has seen no other area of rash. No other concerns today.     Home Meds:   Outpatient Medications Prior to Visit  Medication Sig Dispense Refill  . diazepam (VALIUM) 10 MG tablet Take 1 tablet (10 mg total) by mouth every 8 (eight) hours as needed (muscle spasms). 20 tablet 0  . ibuprofen (ADVIL,MOTRIN) 800 MG tablet Take 1 tablet (800 mg total) by mouth 3 (three) times daily. 21 tablet 0  . amoxicillin (AMOXIL) 875 MG tablet Take 1 tablet (875 mg total) by mouth 2 (two) times daily. 20 tablet 0   No facility-administered medications prior to visit.     Allergies: No Known Allergies    Review of Systems: See HPI for pertinent ROS. All other ROS negative.    Physical Exam: Blood pressure 110/80, pulse 69, temperature 97.9 F (36.6 C), temperature source Oral, resp. rate 14, weight 142 lb 6.4 oz (64.6 kg), SpO2 99 %., Body mass index is 23.7 kg/m. General: AAF.  Appears in no acute distress. Neck: Supple. No thyromegaly. No lymphadenopathy. Lungs: Clear bilaterally to auscultation without wheezes, rales, or rhonchi. Breathing is unlabored. Heart: Regular rhythm. No murmurs, rubs, or gallops. Msk:  Strength and tone normal for age. Skin:She is AfricanAmerican so her skin is brown. The area of "rash" is  located on her right flank directly inferior to axillae but just below   the level of bra. It is 1cm x 0.5 cm oval shaped---it is darker brown than her skin color. It is rough, scaly with palpation. No central clearing appreciated but it is very small area to examine. Neuro: Alert and oriented X 3. Moves all extremities spontaneously. Gait is normal. CNII-XII grossly in tact. Psych:  Responds to questions appropriately with a normal affect.     ASSESSMENT AND PLAN:  51 y.o. year old female with  1. Dermatitis Tinea Corporis vs Atopic Dermatitis:  Treat with Lotrisone. F/U if does not resolve in 2 weeks. - clotrimazole-betamethasone (LOTRISONE) cream; Apply 1 application topically 2 (two) times daily.  Dispense: 30 g; Refill: 0   Signed, [id number]Mary Beth Pax[place], [place], [facility] 05/20/2016 8:30 AM

## 2016-06-16 ENCOUNTER — Ambulatory Visit
Admission: RE | Admit: 2016-06-16 | Discharge: 2016-06-16 | Disposition: A | Discharge status: Home / Self Care | Payer: BC Managed Care – PPO | Source: Ambulatory Visit | Attending: Family Medicine | Admitting: Family Medicine

## 2016-06-16 ENCOUNTER — Ambulatory Visit (INDEPENDENT_AMBULATORY_CARE_PROVIDER_SITE_OTHER): Payer: BC Managed Care – PPO | Admitting: Family Medicine

## 2016-06-16 ENCOUNTER — Encounter: Payer: Self-pay | Admitting: Family Medicine

## 2016-06-16 VITALS — BP 108/76 | HR 80 | Temp 97.5°F | Resp 14 | Ht 65.0 in | Wt 145.0 lb

## 2016-06-16 DIAGNOSIS — R109 Unspecified abdominal pain: Secondary | ICD-10-CM

## 2016-06-16 LAB — URINALYSIS, ROUTINE W REFLEX MICROSCOPIC
Bilirubin Urine: NEGATIVE
GLUCOSE, UA: NEGATIVE
Hgb urine dipstick: NEGATIVE
Ketones, ur: NEGATIVE
Leukocytes, UA: NEGATIVE
Nitrite: NEGATIVE
PH: 6 (ref 5.0–8.0)
Protein, ur: NEGATIVE
SPECIFIC GRAVITY, URINE: 1.01 (ref 1.001–1.035)

## 2016-06-16 NOTE — Progress Notes (Signed)
   Subjective:    Patient ID: Marie Wagner, female    DOB: 12/16/1965, 51 y.o.   MRN: [id number]  Patient presents for L Sided Back Pain (intermittent sharp pain in L side lumbar back with some intermittent abd pain)   Random intermittant pain in left flank area for past week ,its very sharp pain,non radiating, no N/V   Mild discomfort with uriating. No fever. No recent illness   No constipation , no diarrhea  Has taken ibuprofen, which helps      Review Of Systems:  GEN- denies fatigue, fever, weight loss,weakness, recent illness HEENT- denies eye drainage, change in vision, nasal discharge, CVS- denies chest pain, palpitations RESP- denies SOB, cough, wheeze ABD- denies N/V, change in stools, +abd pain GU- denies dysuria, hematuria, dribbling, incontinence MSK- denies joint pain, muscle aches, injury Neuro- denies headache, dizziness, syncope, seizure activity       Objective:    BP 108/76   Pulse 80   Temp 97.5 F (36.4 C) (Oral)   Resp 14   Ht 5\' 5"  (1.651 m)   Wt 145 lb (65.8 kg)   SpO2 99%   BMI 24.13 kg/m  GEN- NAD, alert and oriented x3 HEENT- PERRL, EOMI, non injected sclera, pink conjunctiva, MMM, oropharynx clear Neck- Supple, no thyromegaly CVS- RRR, no murmur RESP-CTAB ABD-NABS,soft,NT,ND, no CVA tenderness EXT- No edema Pulses- Radial, DP- 2+  UA normal      Assessment & Plan:      Problem List Items Addressed This Visit    None    Visit Diagnoses    Flank pain    -  Primary   Exam normal, UA negative, DD possible kidney stone, but no hematuria, no chagne in bowels, MSK pain high on differential resolves with NSAIDS, obtain KUB r/o stones or bowel etiology, if neg treat as musclar pain unless something changes   Relevant Orders   Urinalysis, Routine w reflex microscopic (Completed)   DG Abd 1 View      Note: This dictation was prepared with Dragon dictation along with smaller phrase technology. Any transcriptional errors that result from  this process are unintentional.

## 2016-06-16 NOTE — Patient Instructions (Signed)
F/U pending results Get xray at [address]

## 2016-06-17 ENCOUNTER — Encounter: Payer: Self-pay | Admitting: Physician Assistant

## 2016-06-22 ENCOUNTER — Ambulatory Visit (INDEPENDENT_AMBULATORY_CARE_PROVIDER_SITE_OTHER): Payer: BC Managed Care – PPO | Admitting: Physician Assistant

## 2016-06-22 ENCOUNTER — Encounter: Payer: Self-pay | Admitting: Physician Assistant

## 2016-06-22 VITALS — BP 110/78 | HR 73 | Temp 98.1°F | Resp 16 | Ht 65.0 in | Wt 143.6 lb

## 2016-06-22 DIAGNOSIS — Z0000 Encounter for general adult medical examination without abnormal findings: Secondary | ICD-10-CM | POA: Diagnosis not present

## 2016-06-22 DIAGNOSIS — E559 Vitamin D deficiency, unspecified: Secondary | ICD-10-CM | POA: Diagnosis not present

## 2016-06-22 LAB — LIPID PANEL
CHOL/HDL RATIO: 2.9 ratio (ref <5.0)
CHOLESTEROL: 165 mg/dL (ref <200)
HDL: 56 mg/dL (ref >50)
LDL Cholesterol: 98 mg/dL (ref <100)
Triglycerides: 57 mg/dL (ref <150)
VLDL: 11 mg/dL (ref <30)

## 2016-06-22 LAB — COMPLETE METABOLIC PANEL WITH GFR
ALBUMIN: 4.8 g/dL (ref 3.6–5.1)
ALT: 13 U/L (ref 6–29)
AST: 15 U/L (ref 10–35)
Alkaline Phosphatase: 58 U/L (ref 33–130)
BUN: 12 mg/dL (ref 7–25)
CALCIUM: 9.8 mg/dL (ref 8.6–10.4)
CHLORIDE: 102 mmol/L (ref 98–110)
CO2: 28 mmol/L (ref 20–31)
Creat: 0.77 mg/dL (ref 0.50–1.05)
GFR, Est African American: >89 mL/min (ref >=60)
GLUCOSE: 82 mg/dL (ref 70–99)
POTASSIUM: 4.7 mmol/L (ref 3.5–5.3)
SODIUM: 141 mmol/L (ref 135–146)
Total Bilirubin: 0.4 mg/dL (ref 0.2–1.2)
Total Protein: 8 g/dL (ref 6.1–8.1)

## 2016-06-22 LAB — CBC WITH DIFFERENTIAL/PLATELET
BASOS ABS: 0 {cells}/uL (ref 0–200)
Basophils Relative: 0 %
EOS PCT: 2 %
Eosinophils Absolute: 94 cells/uL (ref 15–500)
HCT: 41.1 % (ref 35.0–45.0)
HEMOGLOBIN: 13.5 g/dL (ref 12.0–15.0)
LYMPHS PCT: 30 %
Lymphs Abs: 1410 cells/uL (ref 850–3900)
MCH: 28 pg (ref 27.0–33.0)
MCHC: 32.8 g/dL (ref 32.0–36.0)
MCV: 85.3 fL (ref 80.0–100.0)
MPV: 9.9 fL (ref 7.5–12.5)
Monocytes Absolute: 282 cells/uL (ref 200–950)
Monocytes Relative: 6 %
NEUTROS PCT: 62 %
Neutro Abs: 2914 cells/uL (ref 1500–7800)
Platelets: 155 10*3/uL (ref 140–400)
RBC: 4.82 MIL/uL (ref 3.80–5.10)
RDW: 13.6 % (ref 11.0–15.0)
WBC: 4.7 10*3/uL (ref 3.8–10.8)

## 2016-06-22 LAB — TSH: TSH: 1.17 mIU/L

## 2016-06-22 NOTE — Progress Notes (Signed)
Patient ID: LAJUNE PERINE MRN: [id number], DOB: [birth year], 51 y.o. Date of Encounter: 06/22/2016,   Chief Complaint: Physical (CPE)  HPI: 51 y.o. y/o female  here for CPE.   She has had prior CPE with me 02/05/12 and 07/17/13.  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  She has 4 children who are "grown".  During the summer she has time to get in more exercise than she does during the school year and enjoys going to country [place] where she walks/runs.  She has a gynecologist. States that she has not had to have repeat Pap smears because she has had hysterectomy. She states that she does have annual mammogram and is due to schedule follow-up and will follow up with scheduling that now.  She has no concerns to address today.   Review of Systems: Consitutional: No fever, chills, fatigue, night sweats, lymphadenopathy. No significant/unexplained weight changes. Eyes: No visual changes, eye redness, or discharge. ENT/Mouth: No ear pain, sore throat, nasal drainage, or sinus pain. Cardiovascular: No chest pressure,heaviness, tightness or squeezing, even with exertion. No increased shortness of breath or dyspnea on exertion.No palpitations, edema, orthopnea, PND. Respiratory: No cough, hemoptysis, SOB, or wheezing. Gastrointestinal: No anorexia, dysphagia, reflux, pain, nausea, vomiting, hematemesis, diarrhea, constipation, BRBPR, or melena. Breast: No mass, nodules, bulging, or retraction. No skin changes or inflammation. No nipple discharge. No lymphadenopathy. Genitourinary: No dysuria, hematuria, incontinence, vaginal discharge, pruritis, burning, abnormal bleeding, or pain. Musculoskeletal: No decreased ROM, No joint pain or swelling. No significant pain in neck, back, or extremities. Skin: No rash, pruritis, or concerning lesions. Neurological: No headache, dizziness, syncope, seizures, tremors, memory  loss, coordination problems, or paresthesias. Psychological: No anxiety, depression, hallucinations, SI/HI. Endocrine: No polydipsia, polyphagia, polyuria, or known diabetes.No increased fatigue. No palpitations/rapid heart rate. No significant/unexplained weight change. All other systems were reviewed and are otherwise negative.  No past medical history on file.   Past Surgical History:  Procedure Laterality Date  . ABDOMINAL HYSTERECTOMY      Home Meds:  Outpatient Medications Prior to Visit  Medication Sig Dispense Refill  . clotrimazole-betamethasone (LOTRISONE) cream Apply 1 application topically 2 (two) times daily. 30 g 0  . diazepam (VALIUM) 10 MG tablet Take 1 tablet (10 mg total) by mouth every 8 (eight) hours as needed (muscle spasms). 20 tablet 0  . ibuprofen (ADVIL,MOTRIN) 800 MG tablet Take 1 tablet (800 mg total) by mouth 3 (three) times daily. 21 tablet 0   No facility-administered medications prior to visit.     Allergies: No Known Allergies  Social History   Social History  . Marital status: Married    Spouse name: N/A  . Number of children: N/A  . Years of education: N/A   Occupational History  . Not on file.   Social History Main Topics  . Smoking status: Never Smoker  . Smokeless tobacco: Never Used  . Alcohol use No  . Drug use: No  . Sexual activity: Yes   Other Topics Concern  . 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   She has been on summer break and started to walk/run almost every day at country [place].   Hopes to continue exercise even once the school year starts back.   Has 4 children who are "grown"  Family History  Problem Relation Age of Onset  . Cancer Mother        melanoma, "bone cancer"  . Cancer Sister [id number]       Breast Cancer    Physical Exam: Blood pressure 110/78, pulse 73, temperature 98.1 F (36.7 C), temperature source Oral,  resp. rate 16, height 5\' 5"  (1.651 m), weight 143 lb 9.6 oz (65.1 kg), SpO2 98 %., Body mass index is 23.9 kg/m. General: Well developed, well nourished AAF. Appears in no acute distress. HEENT: Normocephalic, atraumatic. Conjunctiva pink, sclera non-icteric. Pupils 2 mm constricting to 1 mm, round, regular, and equally reactive to light and accomodation. EOMI. Internal auditory canal clear. TMs with good cone of light and without pathology. Nasal mucosa pink. Nares are without discharge. No sinus tenderness. Oral mucosa pink.  Pharynx without exudate.   Neck: Supple. Trachea midline. No thyromegaly. Full ROM. No lymphadenopathy.No Carotid Bruits. Lungs: Clear to auscultation bilaterally without wheezes, rales, or rhonchi. Breathing is of normal effort and unlabored. Cardiovascular: RRR with S1 S2. No murmurs, rubs, or gallops. Distal pulses 2+ symmetrically. No carotid or abdominal bruits. Breast: Per Gyn Abdomen: Soft, non-tender, non-distended with normoactive bowel sounds. No hepatosplenomegaly or masses. No rebound/guarding. No CVA tenderness. No hernias.  Genitourinary:  Per Gyn Musculoskeletal: Full range of motion and 5/5 strength throughout.  Skin: Warm and moist without erythema, ecchymosis, wounds, or rash. Neuro: A+Ox3. CN II-XII grossly intact. Moves all extremities spontaneously. Full sensation throughout. Normal gait.  Psych:  Responds to questions appropriately with a normal affect.   Assessment/Plan:  51 y.o. y/o female here for CPE  1. Encounter for preventive care  A. Screening Labs: She is fasting. - CBC with Differential/Platelet - COMPLETE METABOLIC PANEL WITH GFR - Lipid panel - TSH  B. Pap: --Has had hysterectomy. Also, sees Gyn  C. Screening Mammogram: --Sees Gyn. She states that follow-up mammogram is due and she is going to follow up with scheduling this. She has a sister with history of breast cancer so she will definitely follow up and have this done while  she is off for the summer.  D. DEXA/BMD:  --Per Gyn  E. Colorectal Cancer Screening: Discussed that she is now age 51. Discussed need for colorectal cancer screening. She is agreeable to have colonoscopy. I discussed the process and have put in referral. - Ambulatory referral to Gastroenterology  F. Immunizations:  Influenza:---N/A Tetanus:----Given here 01/14/2011 Pneumococcal:--She has no indication to require this until age 51 Shingrix: I discussed this with her and wrote this on her AVS as a reminder. She is to call her insurance to find out how much of this they cover what her cost would be and then she will let us know.   2. Vitamin D deficiency History of vitamin D deficiency in the past. Will recheck vitamin D level now. - VITAMIN D 25 Hydroxy (Vit-D Deficiency, Fractures)    Signed, [address]Kylina Vultaggio Beth Alice[place]ixon, [place], [facility] 06/22/2016 11:10 AM

## 2016-06-23 ENCOUNTER — Other Ambulatory Visit: Payer: Self-pay | Admitting: Physician Assistant

## 2016-06-23 ENCOUNTER — Encounter: Payer: Self-pay | Admitting: Gastroenterology

## 2016-06-23 ENCOUNTER — Other Ambulatory Visit: Payer: Self-pay | Admitting: Obstetrics & Gynecology

## 2016-06-23 DIAGNOSIS — Z1211 Encounter for screening for malignant neoplasm of colon: Secondary | ICD-10-CM

## 2016-06-23 DIAGNOSIS — Z1231 Encounter for screening mammogram for malignant neoplasm of breast: Secondary | ICD-10-CM

## 2016-06-23 LAB — VITAMIN D 25 HYDROXY (VIT D DEFICIENCY, FRACTURES): Vit D, 25-Hydroxy: 18 ng/mL — ABNORMAL LOW (ref 30–100)

## 2016-06-25 ENCOUNTER — Other Ambulatory Visit: Payer: Self-pay

## 2016-06-25 MED ORDER — CHOLECALCIFEROL 125 MCG (5000 UT) PO TABS: 1.0000 | ORAL_TABLET | Freq: Every day | ORAL | 2 refills | Status: AC

## 2016-07-16 ENCOUNTER — Ambulatory Visit
Admission: RE | Admit: 2016-07-16 | Discharge: 2016-07-16 | Disposition: A | Discharge status: Home / Self Care | Payer: BC Managed Care – PPO | Source: Ambulatory Visit | Attending: Obstetrics & Gynecology | Admitting: Obstetrics & Gynecology

## 2016-07-16 DIAGNOSIS — Z1231 Encounter for screening mammogram for malignant neoplasm of breast: Secondary | ICD-10-CM

## 2016-08-12 ENCOUNTER — Ambulatory Visit (AMBULATORY_SURGERY_CENTER): Payer: Self-pay

## 2016-08-12 ENCOUNTER — Ambulatory Visit (INDEPENDENT_AMBULATORY_CARE_PROVIDER_SITE_OTHER): Payer: BC Managed Care – PPO | Admitting: Physician Assistant

## 2016-08-12 ENCOUNTER — Encounter: Payer: Self-pay | Admitting: Physician Assistant

## 2016-08-12 VITALS — BP 110/82 | HR 73 | Temp 97.2°F | Resp 16 | Wt 142.2 lb

## 2016-08-12 VITALS — Ht 65.0 in | Wt 141.6 lb

## 2016-08-12 DIAGNOSIS — J3089 Other allergic rhinitis: Secondary | ICD-10-CM

## 2016-08-12 DIAGNOSIS — Z1211 Encounter for screening for malignant neoplasm of colon: Secondary | ICD-10-CM

## 2016-08-12 MED ORDER — CETIRIZINE HCL 10 MG PO TABS: 10.0000 mg | ORAL_TABLET | Freq: Every day | ORAL | 11 refills | Status: AC

## 2016-08-12 MED ORDER — NA SULFATE-K SULFATE-MG SULF 17.5-3.13-1.6 GM/177ML PO SOLN: ORAL | 0 refills | Status: DC

## 2016-08-12 NOTE — Progress Notes (Signed)
    Patient ID: Marie Wagner MRN: [id number], DOB: 04-10-1965, 51 y.o. Date of Encounter: 08/12/2016, 6:32 PM    Chief Complaint:  Chief Complaint  Patient presents with  . right ear rumbling noise    less than a week      HPI: 51 y.o. year old female presents with above.   Seh states that she has noticed rumbling sound in right ear just within past one week -- but only notices it if she is in complete silence. Says if there is any sound at all, she doesnot hear it. Has had no ear pain. Asked if she has had mucus in head, nose or congestion there.  She says that she has congestion and watery clear rhinorrhea "all the time." No thick mucus, no fever. No ST, No cough.      Home Meds:   Outpatient Medications Prior to Visit  Medication Sig Dispense Refill  . Cholecalciferol (NAT-RUL VITAMIN D) 5000 units TABS Take 1 tablet (5,000 Units total) by mouth daily. 30 tablet 2  . clotrimazole-betamethasone (LOTRISONE) cream Apply 1 application topically 2 (two) times daily. (Patient taking differently: Apply 1 application topically as needed. ) 30 g 0  . diazepam (VALIUM) 10 MG tablet Take 1 tablet (10 mg total) by mouth every 8 (eight) hours as needed (muscle spasms). 20 tablet 0  . ibuprofen (ADVIL,MOTRIN) 200 MG tablet Take 200 mg by mouth as needed.    Marland Kitchen ibuprofen (ADVIL,MOTRIN) 800 MG tablet Take 1 tablet (800 mg total) by mouth 3 (three) times daily. 21 tablet 0  . Na Sulfate-K Sulfate-Mg Sulf (SUPREP BOWEL PREP KIT) 17.5-3.13-1.6 GM/180ML SOLN Suprep as directed / no substitutions 354 mL 0  . NON FORMULARY Amberen-Take one daily     No facility-administered medications prior to visit.     Allergies: No Known Allergies    Review of Systems: See HPI for pertinent ROS. All other ROS negative.    Physical Exam: Blood pressure 110/82, pulse 73, temperature (!) 97.2 F (36.2 C), temperature source Oral, resp. rate 16, weight 142 lb 3.2 oz (64.5 kg), SpO2 99 %., Body mass index is  23.66 kg/m. General:  WNWD WF. Appears in no acute distress. HEENT: Normocephalic, atraumatic, eyes without discharge, sclera non-icteric, nares are without discharge. Bilateral auditory canals clear, TM's are without perforation, pearly grey and translucent with reflective cone of light bilaterally. Oral cavity moist, posterior pharynx without exudate, erythema. No tenderness with percussion of frontal or maxillary sinuses bilaterally.   Neck: Supple. No thyromegaly. No lymphadenopathy. Lungs: Clear bilaterally to auscultation without wheezes, rales, or rhonchi. Breathing is unlabored. Heart: Regular rhythm. No murmurs, rubs, or gallops. Msk:  Strength and tone normal for age. Extremities/Skin: Warm and dry.  Neuro: Alert and oriented X 3. Moves all extremities spontaneously. Gait is normal. CNII-XII grossly in tact. Psych:  Responds to questions appropriately with a normal affect.   Audiometry---Normal  ASSESSMENT AND PLAN:  51 y.o. year old female with  1. Non-seasonal allergic rhinitis, unspecified trigger  Discussed with pt that most likely her symptoms are secondary to drainage, congestion behind ear.  Discussed exam findings---no sign of infection. No hearing loss. Take Zyrtec daily. If symptoms not improved in 2 weeks, f/u. - cetirizine (ZYRTEC) 10 MG tablet; Take 1 tablet (10 mg total) by mouth daily.  Dispense: 30 tablet; Refill: [address] [place], [place], [facility] 08/12/2016 6:32 PM

## 2016-08-12 NOTE — Progress Notes (Signed)
Per pt, no allergies to soy or egg products.Pt not taking any weight loss meds or using  O2 at home.   Pt refused Emmi video. 

## 2016-08-13 ENCOUNTER — Encounter: Payer: Self-pay | Admitting: Gastroenterology

## 2016-08-24 ENCOUNTER — Telehealth: Payer: Self-pay | Admitting: Gastroenterology

## 2016-08-26 ENCOUNTER — Encounter: Payer: BC Managed Care – PPO | Admitting: Gastroenterology

## 2016-10-12 ENCOUNTER — Ambulatory Visit (INDEPENDENT_AMBULATORY_CARE_PROVIDER_SITE_OTHER): Payer: BC Managed Care – PPO | Admitting: Family Medicine

## 2016-10-12 DIAGNOSIS — Z111 Encounter for screening for respiratory tuberculosis: Secondary | ICD-10-CM

## 2016-10-12 NOTE — Patient Instructions (Signed)
PPD TB skin test applied to left forearm without difficulty.  Pt states has nevr had positive TB test in the past.  Knows to return on Wednesday of this week for reading.

## 2016-10-14 ENCOUNTER — Ambulatory Visit: Payer: BC Managed Care – PPO

## 2016-10-14 DIAGNOSIS — Z111 Encounter for screening for respiratory tuberculosis: Secondary | ICD-10-CM

## 2016-10-14 LAB — TB SKIN TEST
Induration: 0 mm
TB SKIN TEST: NEGATIVE

## 2016-10-21 ENCOUNTER — Encounter: Payer: Self-pay | Admitting: Physician Assistant

## 2016-10-21 ENCOUNTER — Ambulatory Visit (INDEPENDENT_AMBULATORY_CARE_PROVIDER_SITE_OTHER): Payer: BC Managed Care – PPO | Admitting: Physician Assistant

## 2016-10-21 VITALS — BP 102/72 | HR 70 | Temp 97.8°F | Resp 16 | Ht 65.0 in | Wt 146.8 lb

## 2016-10-21 DIAGNOSIS — E559 Vitamin D deficiency, unspecified: Secondary | ICD-10-CM | POA: Diagnosis not present

## 2016-10-21 NOTE — Progress Notes (Signed)
    Patient ID: RHONNA HOLSTER MRN: [id number], DOB: 05-02-1965, 51 y.o. Date of Encounter: 10/21/2016, 9:22 AM    Chief Complaint:  Chief Complaint  Patient presents with  . follow up on vitamin d     HPI: 51 y.o. year old female is here to follow-up on vitamin D deficiency.  She has been coming in routinely for CPE. Her last CPE with me was 06/2016. At that time vitamin D level was low at 18. Recommended she start taking over-the-counter D 5000 units daily.  Today she reports that she has been taking vitamin D 5000 units daily. She wants to recheck vitamin D level.  No other concerns to address today.     Home Meds:   Outpatient Medications Prior to Visit  Medication Sig Dispense Refill  . cetirizine (ZYRTEC) 10 MG tablet Take 1 tablet (10 mg total) by mouth daily. 30 tablet 11  . Cholecalciferol (NAT-RUL VITAMIN D) 5000 units TABS Take 1 tablet (5,000 Units total) by mouth daily. 30 tablet 2  . clotrimazole-betamethasone (LOTRISONE) cream Apply 1 application topically 2 (two) times daily. (Patient taking differently: Apply 1 application topically as needed. ) 30 g 0  . NON FORMULARY Amberen-Take one daily    . diazepam (VALIUM) 10 MG tablet Take 1 tablet (10 mg total) by mouth every 8 (eight) hours as needed (muscle spasms). 20 tablet 0  . ibuprofen (ADVIL,MOTRIN) 200 MG tablet Take 200 mg by mouth as needed.    Marland Kitchen ibuprofen (ADVIL,MOTRIN) 800 MG tablet Take 1 tablet (800 mg total) by mouth 3 (three) times daily. 21 tablet 0  . Na Sulfate-K Sulfate-Mg Sulf (SUPREP BOWEL PREP KIT) 17.5-3.13-1.6 GM/180ML SOLN Suprep as directed / no substitutions 354 mL 0   No facility-administered medications prior to visit.     Allergies: No Known Allergies    Review of Systems: See HPI for pertinent ROS. All other ROS negative.    Physical Exam: Blood pressure 102/72, pulse 70, temperature 97.8 F (36.6 C), temperature source Oral, resp. rate 16, height _0  (1.651 m), weight 66.6  kg (146 lb 12.8 oz), SpO2 98 %., Body mass index is 24.43 kg/m. General: WNWD AAF.  Appears in no acute distress. Neck: Supple. No thyromegaly. No lymphadenopathy. Lungs: Clear bilaterally to auscultation without wheezes, rales, or rhonchi. Breathing is unlabored. Heart: Regular rhythm. No murmurs, rubs, or gallops. Msk:  Strength and tone normal for age. Extremities/Skin: Warm and dry.  Neuro: Alert and oriented X 3. Moves all extremities spontaneously. Gait is normal. CNII-XII grossly in tact. Psych:  Responds to questions appropriately with a normal affect.     ASSESSMENT AND PLAN:  51 y.o. year old female with  1. Vitamin D deficiency 06/2016 --- vitamin D level was low at 18.  Recommended she start taking over-the-counter D 5000 units daily.  Today she reports that she has been taking vitamin D 5000 units daily. Recheck vitamin D level now. - VITAMIN D 25 Hydroxy (Vit-D Deficiency, Fractures)   Signed, [address] [place], [place], [facility] 10/21/2016 9:22 AM

## 2016-10-22 LAB — VITAMIN D 25 HYDROXY (VIT D DEFICIENCY, FRACTURES): Vit D, 25-Hydroxy: 58 ng/mL (ref 30–100)

## 2017-04-26 ENCOUNTER — Ambulatory Visit: Payer: BC Managed Care – PPO | Admitting: Physician Assistant

## 2017-06-29 ENCOUNTER — Other Ambulatory Visit: Payer: Self-pay | Admitting: Family Medicine

## 2017-06-29 DIAGNOSIS — Z1231 Encounter for screening mammogram for malignant neoplasm of breast: Secondary | ICD-10-CM

## 2018-10-24 ENCOUNTER — Other Ambulatory Visit: Payer: Self-pay | Admitting: Physician Assistant

## 2018-10-24 ENCOUNTER — Other Ambulatory Visit: Payer: Self-pay | Admitting: Family Medicine

## 2018-10-24 DIAGNOSIS — Z1231 Encounter for screening mammogram for malignant neoplasm of breast: Secondary | ICD-10-CM

## 2018-12-13 ENCOUNTER — Other Ambulatory Visit: Payer: Self-pay

## 2018-12-13 ENCOUNTER — Ambulatory Visit
Admission: RE | Admit: 2018-12-13 | Discharge: 2018-12-13 | Disposition: A | Discharge status: Home / Self Care | Payer: BC Managed Care – PPO | Source: Ambulatory Visit | Attending: Family Medicine | Admitting: Family Medicine

## 2018-12-13 DIAGNOSIS — Z1231 Encounter for screening mammogram for malignant neoplasm of breast: Secondary | ICD-10-CM

## 2019-09-08 ENCOUNTER — Encounter: Payer: Self-pay | Admitting: Family Medicine

## 2019-09-08 ENCOUNTER — Other Ambulatory Visit: Payer: Self-pay

## 2019-09-08 ENCOUNTER — Ambulatory Visit (INDEPENDENT_AMBULATORY_CARE_PROVIDER_SITE_OTHER): Payer: BC Managed Care – PPO | Admitting: Family Medicine

## 2019-09-08 VITALS — BP 118/72 | HR 82 | Temp 97.8°F | Resp 16 | Ht 65.0 in | Wt 153.0 lb

## 2019-09-08 DIAGNOSIS — Z833 Family history of diabetes mellitus: Secondary | ICD-10-CM | POA: Diagnosis not present

## 2019-09-08 DIAGNOSIS — N951 Menopausal and female climacteric states: Secondary | ICD-10-CM

## 2019-09-08 DIAGNOSIS — Z0000 Encounter for general adult medical examination without abnormal findings: Secondary | ICD-10-CM

## 2019-09-08 DIAGNOSIS — Z23 Encounter for immunization: Secondary | ICD-10-CM | POA: Diagnosis not present

## 2019-09-08 DIAGNOSIS — Z1159 Encounter for screening for other viral diseases: Secondary | ICD-10-CM

## 2019-09-08 DIAGNOSIS — M7989 Other specified soft tissue disorders: Secondary | ICD-10-CM

## 2019-09-08 DIAGNOSIS — E559 Vitamin D deficiency, unspecified: Secondary | ICD-10-CM

## 2019-09-08 DIAGNOSIS — Z0001 Encounter for general adult medical examination with abnormal findings: Secondary | ICD-10-CM | POA: Diagnosis not present

## 2019-09-08 DIAGNOSIS — R42 Dizziness and giddiness: Secondary | ICD-10-CM

## 2019-09-08 DIAGNOSIS — Z114 Encounter for screening for human immunodeficiency virus [HIV]: Secondary | ICD-10-CM

## 2019-09-08 NOTE — Progress Notes (Signed)
Subjective:    Patient ID: Marie Wagner, female    DOB: January 11, 1965, 54 y.o.   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, wakes up in middle night a lot , this has been gpong on for the past 6 months  She does sleep with the TV on  She does feel tired during the day  The estroven with black cohosh she had effect Amberon helped some  Hot flashes worse during the day   Hysterectomyt > 10years ago  She has had some episdoes of dizzy symptoms, no vertigo symptoms  She is taking vitamin D   Due for colon cancer screening   She is followed with eye doctor - her eyes have blurry  [facility] [employer]     Review Of Systems:  GEN- denies fatigue, fever, weight loss,weakness, recent illness HEENT- denies eye drainage, change in vision, nasal discharge, CVS- denies chest pain, palpitations RESP- denies SOB, cough, wheeze ABD- denies N/V, change in stools, abd pain GU- denies dysuria, hematuria, dribbling, incontinence MSK- denies joint pain, muscle aches, injury Neuro- denies headache, dizziness, syncope, seizure activity       Objective:    BP 118/72   Pulse 82   Temp 97.8 F (36.6 C) (Temporal)   Resp 16   Ht 5\' 5"  (1.651 m)   Wt 153 lb (69.4 kg)   SpO2 99%   BMI 25.46 kg/m  GEN- NAD, alert and oriented x3 HEENT- PERRL, EOMI, non injected sclera, pink conjunctiva, MMM, oropharynx clear, TM clear no effusion Neck- Supple, no thyromegaly CVS- RRR, no murmur RESP-CTAB Nodes- no axillary nodes palpated or mass,  right axilla more prominence to tissue compared to left NEURO-CNIIX-II  In tact no deficits  ABD-NABS,soft,NT,ND EXT- No edema Pulses- Radial, DP- 2+         Assessment & Plan:   Cologuard for colon cancer screening   Problem List Items Addressed This Visit      Unprioritized   Vitamin D deficiency   Relevant Orders   Vitamin D, 25-hydroxy (Completed)    Other Visit Diagnoses    Routine general medical examination at a health care facility    -  Primary   CPE done, fasting labs, check labs for dizzy spell, bp is okay, flu shot given, increase hydration   Relevant Orders   TSH (Completed)   CBC with Differential/Platelet (Completed)   Comprehensive metabolic panel (Completed)   Lipid panel (Completed)   Menopausal symptoms       after review of choices including paxil, recommend going back on low dose of amberon, has family hx breast cancer, avoid hormone therapy   Relevant Orders   TSH (Completed)   Family history of diabetes mellitus       Relevant Orders   Hemoglobin A1c (Completed)   Dizzy spells       Relevant Orders   Hemoglobin A1c (Completed)   Encounter for screening for HIV       Relevant Orders   HIV Antibody (routine testing w rflx)   Need for hepatitis C screening test       Relevant Orders   Hepatitis C antibody   Right  axillary swelling       obtain US, may be increased adipose tissue or subcutaneous cyst though not a well circumcised lesion felt    Relevant Orders   [place] AXILLA RIGHT   Need for immunization against influenza       Relevant Orders   Flu Vaccine QUAD 36+ mos IM (Completed)      Note: This dictation was prepared with Dragon dictation along with smaller phrase technology. Any transcriptional errors that result from this process are unintentional.

## 2019-09-08 NOTE — Patient Instructions (Signed)
[place] to be done [employer] every other day for hot flashes and sleep We will call with lab results  Cologuard for colon cancer screening F/U pending results

## 2019-09-12 ENCOUNTER — Telehealth: Payer: Self-pay | Admitting: *Deleted

## 2019-09-12 ENCOUNTER — Other Ambulatory Visit: Payer: Self-pay | Admitting: *Deleted

## 2019-09-12 LAB — HEMOGLOBIN A1C
Hgb A1c MFr Bld: 5.6 % of total Hgb (ref <5.7)
Mean Plasma Glucose: 114 (calc)
eAG (mmol/L): 6.3 (calc)

## 2019-09-12 LAB — COMPREHENSIVE METABOLIC PANEL
AG Ratio: 1.6 (calc) (ref 1.0–2.5)
ALT: 17 U/L (ref 6–29)
AST: 17 U/L (ref 10–35)
Albumin: 4.8 g/dL (ref 3.6–5.1)
Alkaline phosphatase (APISO): 63 U/L (ref 37–153)
BUN: 10 mg/dL (ref 7–25)
CO2: 27 mmol/L (ref 20–32)
Calcium: 10 mg/dL (ref 8.6–10.4)
Chloride: 100 mmol/L (ref 98–110)
Creat: 0.88 mg/dL (ref 0.50–1.05)
Globulin: 3 g/dL (calc) (ref 1.9–3.7)
Glucose, Bld: 115 mg/dL — ABNORMAL HIGH (ref 65–99)
Potassium: 4.5 mmol/L (ref 3.5–5.3)
Sodium: 138 mmol/L (ref 135–146)
Total Bilirubin: 0.3 mg/dL (ref 0.2–1.2)
Total Protein: 7.8 g/dL (ref 6.1–8.1)

## 2019-09-12 LAB — CBC WITH DIFFERENTIAL/PLATELET
Absolute Monocytes: 336 cells/uL (ref 200–950)
Basophils Absolute: 29 cells/uL (ref 0–200)
Basophils Relative: 0.5 %
Eosinophils Absolute: 70 cells/uL (ref 15–500)
Eosinophils Relative: 1.2 %
HCT: 40.1 % (ref 35.0–45.0)
Hemoglobin: 13.2 g/dL (ref 11.7–15.5)
Lymphs Abs: 1380 cells/uL (ref 850–3900)
MCH: 27.1 pg (ref 27.0–33.0)
MCHC: 32.9 g/dL (ref 32.0–36.0)
MCV: 82.3 fL (ref 80.0–100.0)
MPV: 10.8 fL (ref 7.5–12.5)
Monocytes Relative: 5.8 %
Neutro Abs: 3985 cells/uL (ref 1500–7800)
Neutrophils Relative %: 68.7 %
Platelets: 184 10*3/uL (ref 140–400)
RBC: 4.87 10*6/uL (ref 3.80–5.10)
RDW: 14.5 % (ref 11.0–15.0)
Total Lymphocyte: 23.8 %
WBC: 5.8 10*3/uL (ref 3.8–10.8)

## 2019-09-12 LAB — LIPID PANEL
Cholesterol: 170 mg/dL (ref <200)
HDL: 47 mg/dL — ABNORMAL LOW (ref >=50)
LDL Cholesterol (Calc): 105 mg/dL (calc) — ABNORMAL HIGH
Non-HDL Cholesterol (Calc): 123 mg/dL (calc) (ref <130)
Total CHOL/HDL Ratio: 3.6 (calc) (ref <5.0)
Triglycerides: 90 mg/dL (ref <150)

## 2019-09-12 LAB — HEPATITIS C ANTIBODY
Hepatitis C Ab: NONREACTIVE
SIGNAL TO CUT-OFF: 0.02 (ref <1.00)

## 2019-09-12 LAB — TSH: TSH: 1.41 mIU/L

## 2019-09-12 LAB — VITAMIN D 25 HYDROXY (VIT D DEFICIENCY, FRACTURES): Vit D, 25-Hydroxy: 18 ng/mL — ABNORMAL LOW (ref 30–100)

## 2019-09-12 LAB — HIV ANTIBODY (ROUTINE TESTING W REFLEX): HIV 1&2 Ab, 4th Generation: NONREACTIVE

## 2019-09-12 MED ORDER — VITAMIN D (ERGOCALCIFEROL) 1.25 MG (50000 UNIT) PO CAPS: 50000.0000 [IU] | ORAL_CAPSULE | ORAL | 0 refills | Status: DC

## 2019-09-12 NOTE — Telephone Encounter (Signed)
Received verbal orders for Cologuard.   Order placed via [employer].   Cologuard (Order [id number])

## 2019-09-12 NOTE — Telephone Encounter (Signed)
-----   Message from Salley Scarlet, MD sent at 09/10/2019 10:26 AM EDT ----- Regarding: Send Cologuard

## 2019-09-15 ENCOUNTER — Other Ambulatory Visit: Payer: Self-pay | Admitting: Family Medicine

## 2019-09-15 DIAGNOSIS — M7989 Other specified soft tissue disorders: Secondary | ICD-10-CM

## 2019-09-27 ENCOUNTER — Ambulatory Visit
Admission: RE | Admit: 2019-09-27 | Discharge: 2019-09-27 | Disposition: A | Discharge status: Home / Self Care | Payer: BC Managed Care – PPO | Source: Ambulatory Visit | Attending: Family Medicine | Admitting: Family Medicine

## 2019-09-27 ENCOUNTER — Other Ambulatory Visit: Payer: Self-pay

## 2019-09-27 DIAGNOSIS — M7989 Other specified soft tissue disorders: Secondary | ICD-10-CM

## 2019-10-24 ENCOUNTER — Encounter: Payer: Self-pay | Admitting: Family Medicine

## 2019-10-24 LAB — COLOGUARD: Cologuard: NEGATIVE

## 2019-11-07 NOTE — Telephone Encounter (Signed)
Received the results of Cologuard screening.   Screening noted negative.   A negative result indicates a low likelihood of colorectal cancer is present. Following a negative Cologuard result, the American Cancer Society recommends a Cologuard re-screening interval of 3 years.   Letter sent.   

## 2019-11-29 ENCOUNTER — Other Ambulatory Visit: Payer: Self-pay | Admitting: Family Medicine

## 2019-11-29 DIAGNOSIS — Z1231 Encounter for screening mammogram for malignant neoplasm of breast: Secondary | ICD-10-CM

## 2019-12-07 ENCOUNTER — Other Ambulatory Visit: Payer: Self-pay | Admitting: *Deleted

## 2020-01-12 ENCOUNTER — Ambulatory Visit: Payer: BC Managed Care – PPO

## 2020-03-25 ENCOUNTER — Other Ambulatory Visit: Payer: Self-pay

## 2020-03-25 ENCOUNTER — Ambulatory Visit
Admission: RE | Admit: 2020-03-25 | Discharge: 2020-03-25 | Disposition: A | Discharge status: Home / Self Care | Payer: BC Managed Care – PPO | Source: Ambulatory Visit | Attending: Family Medicine | Admitting: Family Medicine

## 2020-03-25 DIAGNOSIS — Z1231 Encounter for screening mammogram for malignant neoplasm of breast: Secondary | ICD-10-CM

## 2020-12-27 ENCOUNTER — Ambulatory Visit (INDEPENDENT_AMBULATORY_CARE_PROVIDER_SITE_OTHER): Payer: BC Managed Care – PPO | Admitting: Family Medicine

## 2020-12-27 ENCOUNTER — Other Ambulatory Visit: Payer: Self-pay

## 2020-12-27 ENCOUNTER — Encounter: Payer: Self-pay | Admitting: Family Medicine

## 2020-12-27 VITALS — BP 124/82 | HR 76 | Temp 97.2°F | Resp 18 | Ht 65.0 in | Wt 157.0 lb

## 2020-12-27 DIAGNOSIS — Z0000 Encounter for general adult medical examination without abnormal findings: Secondary | ICD-10-CM

## 2020-12-27 DIAGNOSIS — E559 Vitamin D deficiency, unspecified: Secondary | ICD-10-CM

## 2020-12-27 DIAGNOSIS — Z0001 Encounter for general adult medical examination with abnormal findings: Secondary | ICD-10-CM | POA: Diagnosis not present

## 2020-12-27 NOTE — Progress Notes (Signed)
Subjective:    Patient ID: Marie Wagner, female    DOB: [birth year], 55 y.o.   MRN: [id number]  HPI Patient is a very pleasant 55 year old African-American female who presents today for complete physical exam.  She teaches third grade.  She has a history of a hysterectomy in her 30s due to vaginal bleeding.  However she still has her ovaries and therefore she did not go through premature menopause.  She is taking vitamin D but she is not taking calcium.  She is already had most recent COVID shot and a flu shot.  She is due for the shingles vaccine.  The remainder of her preventative care is up-to-date. Mammogram 3/22 Cologuard 10/21 PMH of [facility]  Immunization History  Administered Date(s) Administered   Hepatitis A 02/22/2012   Hepatitis B 02/22/2012   Influenza,inj,Quad PF,6+ Mos 09/08/2019   PFIZER(Purple Top)SARS-COV-2 Vaccination 03/06/2019, 03/26/2019   PPD Test 10/12/2016   Td 01/14/2011   Tdap 01/14/2011    Past Medical History:  Diagnosis Date   Anemia    in past   Complication of anesthesia    per pt, "Hard to wake up" past sedation.   Hemorrhoids    hx of   History of menopause    going thru symptoms of menopause now   History of palpitations    due to stress/over 5 years   Post-operative nausea and vomiting     Past Surgical History:  Procedure Laterality Date   TOTAL ABDOMINAL HYSTERECTOMY     Current Outpatient Medications on File Prior to Visit  Medication Sig Dispense Refill   cetirizine (ZYRTEC) 10 MG tablet Take 1 tablet (10 mg total) by mouth daily. 30 tablet 11   Cholecalciferol (NAT-RUL VITAMIN D) 5000 units TABS Take 1 tablet (5,000 Units total) by mouth daily. 30 tablet 2   clotrimazole-betamethasone (LOTRISONE) cream Apply 1 application topically 2 (two) times daily. (Patient not taking: Reported on 09/08/2019) 30 g 0   No current facility-administered medications on file prior to visit.   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   She has been on summer break and started to walk/run almost every day at [place].   Hopes to continue exercise even once the school year starts back.   Has 4 children who are "grown"   Social Determinants of Health   Financial Resource Strain: Not on file  Food Insecurity: Not on file  Transportation Needs: Not on file  Physical Activity: Not on file  Stress: Not on file  Social Connections: Not on file  Intimate Partner Violence: Not on file   Family History  Problem Relation Age of Onset   Cancer Mother        melanoma, "bone cancer"   Cancer Sister 67       Breast Cancer   Breast cancer Sister 90  Kidney disease Brother      Review of Systems  All other systems reviewed and are negative.     Objective:   Physical Exam Vitals reviewed.  Constitutional:      General: She is not in acute distress.    Appearance: Normal appearance. She is normal weight. She is not ill-appearing or toxic-appearing.  HENT:     Head: Normocephalic and atraumatic.     Right Ear: Tympanic membrane and ear canal normal. There is no impacted cerumen.     Left Ear: Tympanic membrane and ear canal normal. There is no impacted cerumen.     Nose: Nose normal. No congestion or rhinorrhea.     Mouth/Throat:     Mouth: Mucous membranes are moist.     Pharynx: Oropharynx is clear. No oropharyngeal exudate or posterior oropharyngeal erythema.  Eyes:     Extraocular Movements:  Extraocular movements intact.     Conjunctiva/sclera: Conjunctivae normal.     Pupils: Pupils are equal, round, and reactive to light.  Neck:     Vascular: No carotid bruit.  Cardiovascular:     Rate and Rhythm: Normal rate and regular rhythm.     Pulses: Normal pulses.     Heart sounds: Normal heart sounds. No murmur heard.   No friction rub. No gallop.  Pulmonary:     Effort: Pulmonary effort is normal. No respiratory distress.     Breath sounds: Normal breath sounds. No stridor. No wheezing, rhonchi or rales.  Chest:     Chest wall: No tenderness.  Abdominal:     General: Bowel sounds are normal. There is no distension.     Palpations: Abdomen is soft. There is no mass.     Tenderness: There is no abdominal tenderness. There is no guarding or rebound.  Musculoskeletal:     Cervical back: Normal range of motion and neck supple.     Right lower leg: No edema.     Left lower leg: No edema.  Lymphadenopathy:     Cervical: No cervical adenopathy.  Skin:    Coloration: Skin is not jaundiced or pale.     Findings: No bruising, erythema, lesion or rash.  Neurological:     Mental Status: She is alert and oriented to person, place, and time. Mental status is at baseline.     Cranial Nerves: No cranial nerve deficit.     Sensory: No sensory deficit.     Motor: No weakness.     Coordination: Coordination normal.     Gait: Gait normal.     Deep Tendon Reflexes: Reflexes normal.  Psychiatric:        Mood and Affect: Mood normal.        Behavior: Behavior normal.        Thought Content: Thought content normal.        Judgment: Judgment normal.          Assessment & Plan:  Routine general medical examination at a health care facility - Plan: CBC with Differential/Platelet, Lipid panel, COMPLETE METABOLIC PANEL WITH GFR  Vitamin D deficiency - Plan: VITAMIN D 25 Hydroxy (Vit-D Deficiency, Fractures) Physical exam today is completely normal.  I did recommend that she had 1200 mg a  day of calcium to her vitamin D.  I will check a vitamin D level, CBC, lipid panel, and CMP.  Blood pressure is excellent.  I encouraged her to get the shingles vaccine.  Her Cologuard is up-to-date until 2024.  Her mammogram  is up-to-date until March of next year.  She does not require Pap smear due to her hysterectomy.  I would recommend a bone density test at 64.

## 2020-12-28 LAB — CBC WITH DIFFERENTIAL/PLATELET
Absolute Monocytes: 312 cells/uL (ref 200–950)
Basophils Absolute: 19 cells/uL (ref 0–200)
Basophils Relative: 0.4 %
Eosinophils Absolute: 72 cells/uL (ref 15–500)
Eosinophils Relative: 1.5 %
HCT: 40.8 % (ref 35.0–45.0)
Hemoglobin: 13.5 g/dL (ref 11.7–15.5)
Lymphs Abs: 1464 cells/uL (ref 850–3900)
MCH: 27.5 pg (ref 27.0–33.0)
MCHC: 33.1 g/dL (ref 32.0–36.0)
MCV: 83.1 fL (ref 80.0–100.0)
MPV: 10.6 fL (ref 7.5–12.5)
Monocytes Relative: 6.5 %
Neutro Abs: 2933 cells/uL (ref 1500–7800)
Neutrophils Relative %: 61.1 %
Platelets: 197 10*3/uL (ref 140–400)
RBC: 4.91 10*6/uL (ref 3.80–5.10)
RDW: 14.3 % (ref 11.0–15.0)
Total Lymphocyte: 30.5 %
WBC: 4.8 10*3/uL (ref 3.8–10.8)

## 2020-12-28 LAB — LIPID PANEL
Cholesterol: 191 mg/dL (ref <200)
HDL: 49 mg/dL — ABNORMAL LOW (ref >=50)
LDL Cholesterol (Calc): 124 mg/dL (calc) — ABNORMAL HIGH
Non-HDL Cholesterol (Calc): 142 mg/dL (calc) — ABNORMAL HIGH (ref <130)
Total CHOL/HDL Ratio: 3.9 (calc) (ref <5.0)
Triglycerides: 79 mg/dL (ref <150)

## 2020-12-28 LAB — COMPLETE METABOLIC PANEL WITH GFR
AG Ratio: 1.6 (calc) (ref 1.0–2.5)
ALT: 19 U/L (ref 6–29)
AST: 18 U/L (ref 10–35)
Albumin: 4.7 g/dL (ref 3.6–5.1)
Alkaline phosphatase (APISO): 61 U/L (ref 37–153)
BUN: 13 mg/dL (ref 7–25)
CO2: 27 mmol/L (ref 20–32)
Calcium: 9.5 mg/dL (ref 8.6–10.4)
Chloride: 104 mmol/L (ref 98–110)
Creat: 0.76 mg/dL (ref 0.50–1.03)
Globulin: 2.9 g/dL (calc) (ref 1.9–3.7)
Glucose, Bld: 104 mg/dL — ABNORMAL HIGH (ref 65–99)
Potassium: 4.4 mmol/L (ref 3.5–5.3)
Sodium: 142 mmol/L (ref 135–146)
Total Bilirubin: 0.3 mg/dL (ref 0.2–1.2)
Total Protein: 7.6 g/dL (ref 6.1–8.1)
eGFR: 92 mL/min/{1.73_m2} (ref >=60)

## 2020-12-28 LAB — VITAMIN D 25 HYDROXY (VIT D DEFICIENCY, FRACTURES): Vit D, 25-Hydroxy: 45 ng/mL (ref 30–100)

## 2021-06-16 ENCOUNTER — Other Ambulatory Visit: Payer: Self-pay | Admitting: Family Medicine

## 2021-06-16 DIAGNOSIS — Z1231 Encounter for screening mammogram for malignant neoplasm of breast: Secondary | ICD-10-CM

## 2021-06-20 ENCOUNTER — Ambulatory Visit
Admission: RE | Admit: 2021-06-20 | Discharge: 2021-06-20 | Disposition: A | Discharge status: Home / Self Care | Payer: BC Managed Care – PPO | Source: Ambulatory Visit | Attending: Family Medicine | Admitting: Family Medicine

## 2021-06-20 DIAGNOSIS — Z1231 Encounter for screening mammogram for malignant neoplasm of breast: Secondary | ICD-10-CM

## 2022-03-13 ENCOUNTER — Encounter: Payer: BC Managed Care – PPO | Admitting: Family Medicine

## 2022-04-24 ENCOUNTER — Encounter: Payer: Self-pay | Admitting: Family Medicine

## 2022-04-24 ENCOUNTER — Ambulatory Visit (INDEPENDENT_AMBULATORY_CARE_PROVIDER_SITE_OTHER): Payer: BC Managed Care – PPO | Admitting: Family Medicine

## 2022-04-24 VITALS — BP 126/72 | HR 73 | Temp 98.3°F | Ht 65.0 in | Wt 163.4 lb

## 2022-04-24 DIAGNOSIS — Z0001 Encounter for general adult medical examination with abnormal findings: Secondary | ICD-10-CM

## 2022-04-24 DIAGNOSIS — E559 Vitamin D deficiency, unspecified: Secondary | ICD-10-CM

## 2022-04-24 DIAGNOSIS — E7800 Pure hypercholesterolemia, unspecified: Secondary | ICD-10-CM

## 2022-04-24 DIAGNOSIS — Z1211 Encounter for screening for malignant neoplasm of colon: Secondary | ICD-10-CM

## 2022-04-24 DIAGNOSIS — Z0000 Encounter for general adult medical examination without abnormal findings: Secondary | ICD-10-CM

## 2022-04-24 NOTE — Progress Notes (Signed)
Subjective:    Patient ID: Marie Wagner, female    DOB: 19-Oct-1965, 57 y.o.   MRN: [id number]  HPI Patient is a very pleasant 57 year old African-American female who presents today for complete physical exam.  She teaches third grade.  She has a history of a hysterectomy in her 30s due to vaginal bleeding.  However she still has her ovaries and therefore she did not go through premature menopause.  Mammogram was 6/23 (normal) Cologuard 10/21 (negative) We discussed colon cancer screening.  The patient would like to have a colonoscopy rather than Cologuard.  I will be happy to facilitate that.  She is due for a tetanus shot.  She defers that today.  She is due for shingles vaccine.  She defers that today.  Otherwise she been doing well.  She has been taking fish oil.  She would like to recheck her cholesterol.  She consistently takes vitamin D however she has not been taking calcium. Immunization History  Administered Date(s) Administered   Hepatitis A 02/22/2012   Hepatitis B 02/22/2012   Influenza,inj,Quad PF,6+ Mos 09/08/2019   PFIZER(Purple Top)SARS-COV-2 Vaccination 03/06/2019, 03/26/2019   PPD Test 10/12/2016   Td 01/14/2011   Tdap 01/14/2011    Past Medical History:  Diagnosis Date   Anemia    in past   Complication of anesthesia    per pt, "Hard to wake up" past sedation.   Hemorrhoids    hx of   History of menopause    going thru symptoms of menopause now   History of palpitations    due to stress/over 5 years   Post-operative nausea and vomiting     Past Surgical History:  Procedure Laterality Date   TOTAL ABDOMINAL HYSTERECTOMY     Current Outpatient Medications on File Prior to Visit  Medication Sig Dispense Refill   cetirizine (ZYRTEC) 10 MG tablet Take 1 tablet (10 mg total) by mouth daily. 30 tablet 11   Cholecalciferol (NAT-RUL VITAMIN D) 5000 units TABS Take 1 tablet (5,000 Units total) by mouth daily. 30 tablet 2   No current facility-administered  medications on file prior to visit.   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   She has been on summer break and started to walk/run almost every day at [place].   Hopes to continue exercise even once the school year starts back.   Has 4 children who are "grown"   Social Determinants of Health   Financial Resource Strain: Not on file  Food Insecurity: Not on file  Transportation Needs: Not on file  Physical Activity: Not on file  Stress: Not on file  Social Connections: Not on file  Intimate Partner Violence: Not on file   Family History  Problem Relation Age of Onset   Cancer Mother        melanoma, "bone cancer"  Cancer Sister 73       Breast Cancer   Breast cancer Sister 58   Kidney disease Brother      Review of Systems  All other systems reviewed and are negative.      Objective:   Physical Exam Vitals reviewed.  Constitutional:      General: She is not in acute distress.    Appearance: Normal appearance. She is normal weight. She is not ill-appearing or toxic-appearing.  HENT:     Head: Normocephalic and atraumatic.     Right Ear: Tympanic membrane and ear canal normal. There is no impacted cerumen.     Left Ear: Tympanic membrane and ear canal normal. There is no impacted cerumen.     Nose: Nose normal. No congestion or rhinorrhea.     Mouth/Throat:     Mouth: Mucous membranes are moist.     Pharynx: Oropharynx is clear. No  oropharyngeal exudate or posterior oropharyngeal erythema.  Eyes:     Extraocular Movements: Extraocular movements intact.     Conjunctiva/sclera: Conjunctivae normal.     Pupils: Pupils are equal, round, and reactive to light.  Neck:     Vascular: No carotid bruit.  Cardiovascular:     Rate and Rhythm: Normal rate and regular rhythm.     Pulses: Normal pulses.     Heart sounds: Normal heart sounds. No murmur heard.    No friction rub. No gallop.  Pulmonary:     Effort: Pulmonary effort is normal. No respiratory distress.     Breath sounds: Normal breath sounds. No stridor. No wheezing, rhonchi or rales.  Chest:     Chest wall: No tenderness.  Abdominal:     General: Bowel sounds are normal. There is no distension.     Palpations: Abdomen is soft. There is no mass.     Tenderness: There is no abdominal tenderness. There is no guarding or rebound.  Musculoskeletal:     Cervical back: Normal range of motion and neck supple.     Right lower leg: No edema.     Left lower leg: No edema.  Lymphadenopathy:     Cervical: No cervical adenopathy.  Skin:    Coloration: Skin is not jaundiced or pale.     Findings: No bruising, erythema, lesion or rash.  Neurological:     Mental Status: She is alert and oriented to person, place, and time. Mental status is at baseline.     Cranial Nerves: No cranial nerve deficit.     Sensory: No sensory deficit.     Motor: No weakness.     Coordination: Coordination normal.     Gait: Gait normal.     Deep Tendon Reflexes: Reflexes normal.  Psychiatric:        Mood and Affect: Mood normal.        Behavior: Behavior normal.        Thought Content: Thought content normal.        Judgment: Judgment normal.           Assessment & Plan:  Routine general medical examination at a health care facility - Plan: CBC with Differential/Platelet, COMPLETE METABOLIC PANEL WITH GFR, Lipid panel, VITAMIN D 25 Hydroxy (Vit-D Deficiency, Fractures)  Vitamin D  deficiency - Plan: VITAMIN D 25 Hydroxy (Vit-D Deficiency, Fractures)  Pure hypercholesterolemia - Plan: CBC with Differential/Platelet, COMPLETE METABOLIC PANEL WITH GFR, Lipid panel  Colon cancer screening - Plan: Ambulatory referral to Gastroenterology Recommended a tetanus shot as well  as the shingles vaccine.  Patient is welcome to come at any time will be glad to give those to her.  Mammogram is up-to-date.  Will schedule the patient to meet with a gastroenterologist to discuss colonoscopy.  Check CBC CMP lipid panel and vitamin D.  Ideally I like to see her LDL cholesterol less than 161 and vitamin D level above 30.  I did recommend trying to take calcium 1200 mg a day with her vitamin D to help prevent osteoporosis.  I would recommend a bone density test at age 7.

## 2022-04-25 LAB — CBC WITH DIFFERENTIAL/PLATELET
Absolute Monocytes: 301 cells/uL (ref 200–950)
Basophils Absolute: 20 cells/uL (ref 0–200)
Basophils Relative: 0.4 %
Eosinophils Absolute: 71 cells/uL (ref 15–500)
Eosinophils Relative: 1.4 %
HCT: 40.9 % (ref 35.0–45.0)
Hemoglobin: 13.3 g/dL (ref 11.7–15.5)
Lymphs Abs: 1515 cells/uL (ref 850–3900)
MCH: 26.4 pg — ABNORMAL LOW (ref 27.0–33.0)
MCHC: 32.5 g/dL (ref 32.0–36.0)
MCV: 81.2 fL (ref 80.0–100.0)
MPV: 10.1 fL (ref 7.5–12.5)
Monocytes Relative: 5.9 %
Neutro Abs: 3193 cells/uL (ref 1500–7800)
Neutrophils Relative %: 62.6 %
Platelets: 207 10*3/uL (ref 140–400)
RBC: 5.04 10*6/uL (ref 3.80–5.10)
RDW: 14.6 % (ref 11.0–15.0)
Total Lymphocyte: 29.7 %
WBC: 5.1 10*3/uL (ref 3.8–10.8)

## 2022-04-25 LAB — LIPID PANEL
Cholesterol: 216 mg/dL — ABNORMAL HIGH (ref <200)
HDL: 47 mg/dL — ABNORMAL LOW (ref >=50)
LDL Cholesterol (Calc): 147 mg/dL (calc) — ABNORMAL HIGH
Non-HDL Cholesterol (Calc): 169 mg/dL (calc) — ABNORMAL HIGH (ref <130)
Total CHOL/HDL Ratio: 4.6 (calc) (ref <5.0)
Triglycerides: 103 mg/dL (ref <150)

## 2022-04-25 LAB — COMPLETE METABOLIC PANEL WITH GFR
AG Ratio: 1.7 (calc) (ref 1.0–2.5)
ALT: 24 U/L (ref 6–29)
AST: 19 U/L (ref 10–35)
Albumin: 4.9 g/dL (ref 3.6–5.1)
Alkaline phosphatase (APISO): 65 U/L (ref 37–153)
BUN: 11 mg/dL (ref 7–25)
CO2: 26 mmol/L (ref 20–32)
Calcium: 10.4 mg/dL (ref 8.6–10.4)
Chloride: 105 mmol/L (ref 98–110)
Creat: 0.9 mg/dL (ref 0.50–1.03)
Globulin: 2.9 g/dL (calc) (ref 1.9–3.7)
Glucose, Bld: 108 mg/dL — ABNORMAL HIGH (ref 65–99)
Potassium: 4.8 mmol/L (ref 3.5–5.3)
Sodium: 142 mmol/L (ref 135–146)
Total Bilirubin: 0.3 mg/dL (ref 0.2–1.2)
Total Protein: 7.8 g/dL (ref 6.1–8.1)
eGFR: 75 mL/min/{1.73_m2} (ref >=60)

## 2022-04-25 LAB — VITAMIN D 25 HYDROXY (VIT D DEFICIENCY, FRACTURES): Vit D, 25-Hydroxy: 33 ng/mL (ref 30–100)

## 2022-04-27 ENCOUNTER — Encounter: Payer: Self-pay | Admitting: Gastroenterology

## 2022-04-27 ENCOUNTER — Encounter: Payer: Self-pay | Admitting: Family Medicine

## 2022-06-18 ENCOUNTER — Other Ambulatory Visit: Payer: Self-pay | Admitting: Family Medicine

## 2022-06-18 DIAGNOSIS — Z1231 Encounter for screening mammogram for malignant neoplasm of breast: Secondary | ICD-10-CM

## 2022-07-03 ENCOUNTER — Ambulatory Visit
Admission: RE | Admit: 2022-07-03 | Discharge: 2022-07-03 | Disposition: A | Discharge status: Home / Self Care | Payer: BC Managed Care – PPO | Source: Ambulatory Visit | Attending: Family Medicine | Admitting: Family Medicine

## 2022-07-03 DIAGNOSIS — Z1231 Encounter for screening mammogram for malignant neoplasm of breast: Secondary | ICD-10-CM

## 2022-07-06 ENCOUNTER — Encounter: Payer: Self-pay | Admitting: Gastroenterology

## 2022-07-06 ENCOUNTER — Telehealth: Payer: Self-pay

## 2022-07-06 ENCOUNTER — Ambulatory Visit (AMBULATORY_SURGERY_CENTER): Payer: BC Managed Care – PPO

## 2022-07-06 VITALS — Ht 65.0 in | Wt 157.0 lb

## 2022-07-06 DIAGNOSIS — Z1211 Encounter for screening for malignant neoplasm of colon: Secondary | ICD-10-CM

## 2022-07-06 MED ORDER — NA SULFATE-K SULFATE-MG SULF 17.5-3.13-1.6 GM/177ML PO SOLN: 1.0000 | Freq: Once | ORAL | 0 refills | Status: AC

## 2022-07-06 NOTE — Progress Notes (Signed)
No egg or soy allergy known to patient  No issues known to pt with past sedation with any surgeries or procedures Patient denies ever being told they had issues or difficulty with intubation  No FH of Malignant Hyperthermia Pt is not on diet pills Pt is not on  home 02  Pt is not on blood thinners  Pt denies issues with constipation  No A fib or A flutter Have any cardiac testing pending--no  LOA: independent  Prep: suprep   Patient's chart reviewed by John Nulty CNRA prior to previsit and patient appropriate for the [facility].  Previsit completed and red dot placed by patient's name on their procedure day (on provider's schedule).     PV competed with patient. Prep instructions sent via mychart and home address. Goodrx coupon for [facility] provided to use for price reduction if needed.   

## 2022-07-06 NOTE — Telephone Encounter (Signed)
PV in progress  

## 2022-07-29 ENCOUNTER — Ambulatory Visit (AMBULATORY_SURGERY_CENTER): Payer: BC Managed Care – PPO | Admitting: Gastroenterology

## 2022-07-29 ENCOUNTER — Encounter: Payer: Self-pay | Admitting: Gastroenterology

## 2022-07-29 VITALS — BP 101/66 | HR 62 | Temp 97.1°F | Resp 13 | Ht 65.0 in | Wt 157.0 lb

## 2022-07-29 DIAGNOSIS — D123 Benign neoplasm of transverse colon: Secondary | ICD-10-CM

## 2022-07-29 DIAGNOSIS — Z1211 Encounter for screening for malignant neoplasm of colon: Secondary | ICD-10-CM

## 2022-07-29 MED ORDER — SODIUM CHLORIDE 0.9 % IV SOLN: 500.0000 mL | INTRAVENOUS | Status: DC

## 2022-07-29 NOTE — Op Note (Signed)
[facility] Patient Name: Marie Wagner Procedure Date: 07/29/2022 9:01 AM MRN: [id number] Endoscopist: Sherilyn Cooter L. Myrtie Neither , MD, [id number] Age: 57 Referring MD:  Date of Birth: Aug 16, 1965 Gender: Female Account #: [id number] Procedure:                Colonoscopy Indications:              Screening for colorectal malignant neoplasm, This                            is the patient's first colonoscopy Medicines:                Monitored Anesthesia Care Procedure:                Pre-Anesthesia Assessment:                           - Prior to the procedure, a History and Physical                            was performed, and patient medications and                            allergies were reviewed. The patient's tolerance of                            previous anesthesia was also reviewed. The risks                            and benefits of the procedure and the sedation                            options and risks were discussed with the patient.                            All questions were answered, and informed consent                            was obtained. Prior Anticoagulants: The patient has                            taken no anticoagulant or antiplatelet agents. ASA                            Grade Assessment: II - A patient with mild systemic                            disease. After reviewing the risks and benefits,                            the patient was deemed in satisfactory condition to                            undergo the procedure.  After obtaining informed consent, the colonoscope                            was passed under direct vision. Throughout the                            procedure, the patient's blood pressure, pulse, and                            oxygen saturations were monitored continuously. The                            Olympus Scope SN: [id number] was introduced through                            the anus and advanced to  the the cecum, identified                            by appendiceal orifice and ileocecal valve. The                            colonoscopy was performed without difficulty. The                            patient tolerated the procedure well. The quality                            of the bowel preparation was excellent. The                            ileocecal valve, appendiceal orifice, and rectum                            were photographed. Scope In: 9:06:00 AM Scope Out: 9:21:12 AM Scope Withdrawal Time: 0 hours 12 minutes 9 seconds  Total Procedure Duration: 0 hours 15 minutes 12 seconds  Findings:                 The perianal and digital rectal examinations were                            normal.                           Repeat examination of right colon under NBI                            performed.                           Two sessile polyps were found in the transverse                            colon. The polyps were 4 to 6 mm in size. These  polyps were removed with a cold snare. Resection                            and retrieval were complete.                           Diverticula were found in the left colon.                           Internal hemorrhoids were found.                           The exam was otherwise without abnormality on                            direct and retroflexion views. Complications:            No immediate complications. Estimated Blood Loss:     Estimated blood loss was minimal. Impression:               - Two 4 to 6 mm polyps in the transverse colon,                            removed with a cold snare. Resected and retrieved.                           - Diverticulosis in the left colon.                           - Internal hemorrhoids.                           - The examination was otherwise normal on direct                            and retroflexion views. Recommendation:           - Patient has a contact number  available for                            emergencies. The signs and symptoms of potential                            delayed complications were discussed with the                            patient. Return to normal activities tomorrow.                            Written discharge instructions were provided to the                            patient.                           - Resume previous diet.                           -  Continue present medications.                           - Await pathology results.                           - Repeat colonoscopy is recommended for                            surveillance. The colonoscopy date will be                            determined after pathology results from today's                            exam become available for review. Alexandra Lipps L. Myrtie Neither, MD 07/29/2022 9:24:16 AM This report has been signed electronically.

## 2022-07-29 NOTE — Progress Notes (Signed)
History and Physical:  This patient presents for endoscopic testing for: Encounter Diagnosis  Name Primary?   Special screening for malignant neoplasms, colon Yes    Average risk for colorectal cancer.  First screening exam.  Patient denies chronic abdominal pain, rectal bleeding, constipation or diarrhea.    Patient is otherwise without complaints or active issues today.   Past Medical History: Past Medical History:  Diagnosis Date   Anemia    in past   Complication of anesthesia    per pt, "Hard to wake up" past sedation.   Hemorrhoids    hx of   History of menopause    going thru symptoms of menopause now   History of palpitations    due to stress/over 5 years   HLD (hyperlipidemia)    Post-operative nausea and vomiting      Past Surgical History: Past Surgical History:  Procedure Laterality Date   TOTAL ABDOMINAL HYSTERECTOMY      Allergies: No Known Allergies  Outpatient Meds: Current Outpatient Medications  Medication Sig Dispense Refill   Cholecalciferol (NAT-RUL VITAMIN D) 5000 units TABS Take 1 tablet (5,000 Units total) by mouth daily. 30 tablet 2   cetirizine (ZYRTEC) 10 MG tablet Take 1 tablet (10 mg total) by mouth daily. 30 tablet 11   IBUPROFEN PO Take by mouth as needed.     Current Facility-Administered Medications  Medication Dose Route Frequency Provider Last Rate Last Admin   0.9 %  sodium chloride infusion  500 mL Intravenous Continuous Danis, Starr Lake III, MD          ___________________________________________________________________ Objective   Exam:  BP 132/71   Pulse 72   Temp (!) 97.1 F (36.2 C) (Temporal)   Ht 5\' 5"  (1.651 m)   Wt 157 lb (71.2 kg)   SpO2 99%   BMI 26.13 kg/m   CV: regular , S1/S2 Resp: clear to auscultation bilaterally, normal RR and effort noted GI: soft, no tenderness, with active bowel sounds.   Assessment: Encounter Diagnosis  Name Primary?   Special screening for malignant neoplasms, colon  Yes     Plan: Colonoscopy   The benefits and risks of the planned procedure were described in detail with the patient or (when appropriate) their health care proxy.  Risks were outlined as including, but not limited to, bleeding, infection, perforation, adverse medication reaction leading to cardiac or pulmonary decompensation, pancreatitis (if ERCP).  The limitation of incomplete mucosal visualization was also discussed.  No guarantees or warranties were given.  The patient is appropriate for an endoscopic procedure in the ambulatory setting.   - Amada Jupiter, MD

## 2022-07-29 NOTE — Progress Notes (Signed)
Pt's states no medical or surgical changes since previsit or office visit. 

## 2022-07-29 NOTE — Patient Instructions (Signed)
Resume previous diet and medications. Awaiting pathology results. Repeat Colonoscopy date to be determined based on pathology results. Handouts provided on Colon polyps, Diverticulosis and Hemorrhoids   YOU HAD AN ENDOSCOPIC PROCEDURE TODAY AT [facility]:   Refer to the procedure report that was given to you for any specific questions about what was found during the examination.  If the procedure report does not answer your questions, please call your gastroenterologist to clarify.  If you requested that your care partner not be given the details of your procedure findings, then the procedure report has been included in a sealed envelope for you to review at your convenience later.  YOU SHOULD EXPECT: Some feelings of bloating in the abdomen. Passage of more gas than usual.  Walking can help get rid of the air that was put into your GI tract during the procedure and reduce the bloating. If you had a lower endoscopy (such as a colonoscopy or flexible sigmoidoscopy) you may notice spotting of blood in your stool or on the toilet paper. If you underwent a bowel prep for your procedure, you may not have a normal bowel movement for a few days.  Please Note:  You might notice some irritation and congestion in your nose or some drainage.  This is from the oxygen used during your procedure.  There is no need for concern and it should clear up in a day or so.  SYMPTOMS TO REPORT IMMEDIATELY:  Following lower endoscopy (colonoscopy or flexible sigmoidoscopy):  Excessive amounts of blood in the stool  Significant tenderness or worsening of abdominal pains  Swelling of the abdomen that is new, acute  Fever of 100F or higher  For urgent or emergent issues, a gastroenterologist can be reached at any hour by calling [phone]. Do not use MyChart messaging for urgent concerns.    DIET:  We do recommend a small meal at first, but then you may proceed to your regular diet.  Drink plenty of  fluids but you should avoid alcoholic beverages for 24 hours.  ACTIVITY:  You should plan to take it easy for the rest of today and you should NOT DRIVE or use heavy machinery until tomorrow (because of the sedation medicines used during the test).    FOLLOW UP: Our staff will call the number listed on your records the next business day following your procedure.  We will call around 7:15- 8:00 am to check on you and address any questions or concerns that you may have regarding the information given to you following your procedure. If we do not reach you, we will leave a message.     If any biopsies were taken you will be contacted by phone or by letter within the next 1-3 weeks.  Please call us at [phone] if you have not heard about the biopsies in 3 weeks.    SIGNATURES/CONFIDENTIALITY: You and/or your care partner have signed paperwork which will be entered into your electronic medical record.  These signatures attest to the fact that that the information above on your After Visit Summary has been reviewed and is understood.  Full responsibility of the confidentiality of this discharge information lies with you and/or your care-partner.

## 2022-07-29 NOTE — Progress Notes (Signed)
Uneventful anesthetic. Report to pacu rn. Vss. Care resumed by rn. 

## 2022-07-29 NOTE — Progress Notes (Signed)
Called to room to assist during endoscopic procedure.  Patient ID and intended procedure confirmed with present staff. Received instructions for my participation in the procedure from the performing physician.  

## 2022-07-30 ENCOUNTER — Telehealth: Payer: Self-pay | Admitting: *Deleted

## 2022-07-30 NOTE — Telephone Encounter (Signed)
Attempted to call patient for their post-procedure follow-up call. No answer. Left voicemail.   

## 2022-08-11 ENCOUNTER — Encounter: Payer: Self-pay | Admitting: Gastroenterology

## 2022-11-02 ENCOUNTER — Ambulatory Visit: Payer: BC Managed Care – PPO | Admitting: Family Medicine

## 2022-11-02 ENCOUNTER — Encounter: Payer: Self-pay | Admitting: Family Medicine

## 2022-11-02 VITALS — BP 120/76 | HR 66 | Temp 98.7°F | Ht 65.0 in | Wt 151.6 lb

## 2022-11-02 DIAGNOSIS — E7800 Pure hypercholesterolemia, unspecified: Secondary | ICD-10-CM

## 2022-11-02 NOTE — Progress Notes (Signed)
Subjective:    Patient ID: Marie Wagner, female    DOB: [birth year], 57 y.o.   MRN: [id number]  HPI Patient is a very pleasant 57 year old African-American female who presents today for follow up of hyperlipidemia.  LDL was elevated at 147 at last visit and fish oil was recommended.  Blood sugar was mildly elevated at 108 as well.  Since I last saw the patient, she states that she has made some dietary changes.  She is also taking fish oil 2000 mg a day.  She denies any side effects from the fish oil.  She denies any chest pain or shortness of breath or dyspnea on exertion.  Her flu shot is up-to-date.  She is here today primarily to recheck her lab work Past Medical History:  Diagnosis Date   Anemia    in past   Complication of anesthesia    per pt, "Hard to wake up" past sedation.   Hemorrhoids    hx of   History of menopause    going thru symptoms of menopause now   History of palpitations    due to stress/over 5 years   HLD (hyperlipidemia)    Post-operative nausea and vomiting     Past Surgical History:  Procedure Laterality Date   TOTAL ABDOMINAL HYSTERECTOMY     Current Outpatient Medications on File Prior to Visit  Medication Sig Dispense Refill   cetirizine (ZYRTEC) 10 MG tablet Take 1 tablet (10 mg total) by mouth daily. 30 tablet 11   Cholecalciferol (NAT-RUL VITAMIN D) 5000 units TABS Take 1 tablet (5,000 Units total) by mouth daily. 30 tablet 2   IBUPROFEN PO Take by mouth as needed.     No current facility-administered medications on file prior to visit.   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   She has been on summer break and started to walk/run almost every day [place].   Hopes to continue exercise even once the school year starts back.   Has 4 children who are "grown"   Social Determinants of Health   Financial Resource Strain: Low Risk  (10/29/2022)   Overall Financial Resource Strain (CARDIA)    Difficulty of Paying Living Expenses: Not very hard  Food Insecurity: No Food Insecurity (10/29/2022)   Hunger Vital Sign    Worried About Running Out of Food in the Last Year: Never true    Ran Out of Food in the Last Year: Never true  Transportation Needs: No Transportation Needs (10/29/2022)   PRAPARE - Administrator, Civil Service (Medical): No    Lack of Transportation (Non-Medical): No  Physical Activity: Sufficiently Active (10/29/2022)   Exercise Vital Sign    Days of Exercise per Week: 5 days  Minutes of Exercise per Session: 100 min  Stress: No Stress Concern Present (10/29/2022)   [employer] of Occupational Health - Occupational Stress Questionnaire    Feeling of Stress : Not at all  Social Connections: Socially Integrated (10/29/2022)   Social Connection and Isolation Panel [NHANES]    Frequency of Communication with Friends and Family: Three times a week    Frequency of Social Gatherings with Friends and Family: Once a week    Attends Religious Services: More than 4 times per year    Active Member of [employer] or Organizations: Yes    Attends Engineer, structural: More than 4 times per year    Marital Status: Married  Catering manager Violence: Not on file   Family History  Problem Relation Age of Onset   Cancer Mother        melanoma, "bone cancer"   Cancer Sister 37       Breast  Cancer   Breast cancer Sister 28   Kidney disease Brother    Colon cancer Neg Hx    Colon polyps Neg Hx    Esophageal cancer Neg Hx    Rectal cancer Neg Hx    Stomach cancer Neg Hx      Review of Systems  All other systems reviewed and are negative.      Objective:   Physical Exam Vitals reviewed.  Constitutional:      General: She is not in acute distress.    Appearance: Normal appearance. She is normal weight. She is not ill-appearing or toxic-appearing.  HENT:     Head: Normocephalic and atraumatic.  Neck:     Vascular: No carotid bruit.  Cardiovascular:     Rate and Rhythm: Normal rate and regular rhythm.     Pulses: Normal pulses.     Heart sounds: Normal heart sounds. No murmur heard.    No friction rub. No gallop.  Pulmonary:     Effort: Pulmonary effort is normal. No respiratory distress.     Breath sounds: Normal breath sounds. No stridor. No wheezing, rhonchi or rales.  Chest:     Chest wall: No tenderness.  Abdominal:     General: Bowel sounds are normal. There is no distension.     Palpations: Abdomen is soft. There is no mass.     Tenderness: There is no abdominal tenderness. There is no guarding or rebound.  Musculoskeletal:     Right lower leg: No edema.     Left lower leg: No edema.  Skin:    Findings: No rash.  Neurological:     Mental Status: She is alert and oriented to person, place, and time. Mental status is at baseline.     Cranial Nerves: No cranial nerve deficit.     Coordination: Coordination normal.           Assessment & Plan:  Pure hypercholesterolemia - Plan: COMPLETE METABOLIC PANEL WITH GFR, Lipid panel Repeat fasting lipid panel.  If LDL cholesterol is under 130 normal changes are necessary.  If her LDL cholesterol is slightly above 130 may want to consider get a coronary artery calcium score to risk stratify the patient before starting a statin.  Patient is in favor of doing that.  If the LDL cholesterol is extremely high, we  will likely start the patient on statin.  We discussed these options and the patient is in agreement with this plan.  Blood pressure today is excellent at 120/76.

## 2022-11-03 LAB — COMPLETE METABOLIC PANEL WITH GFR
AG Ratio: 1.7 (calc) (ref 1.0–2.5)
ALT: 15 U/L (ref 6–29)
AST: 14 U/L (ref 10–35)
Albumin: 4.8 g/dL (ref 3.6–5.1)
Alkaline phosphatase (APISO): 62 U/L (ref 37–153)
BUN: 10 mg/dL (ref 7–25)
CO2: 29 mmol/L (ref 20–32)
Calcium: 9.5 mg/dL (ref 8.6–10.4)
Chloride: 103 mmol/L (ref 98–110)
Creat: 0.81 mg/dL (ref 0.50–1.03)
Globulin: 2.8 g/dL (ref 1.9–3.7)
Glucose, Bld: 97 mg/dL (ref 65–99)
Potassium: 4 mmol/L (ref 3.5–5.3)
Sodium: 140 mmol/L (ref 135–146)
Total Bilirubin: 0.3 mg/dL (ref 0.2–1.2)
Total Protein: 7.6 g/dL (ref 6.1–8.1)
eGFR: 85 mL/min/{1.73_m2} (ref >=60)

## 2022-11-03 LAB — LIPID PANEL
Cholesterol: 163 mg/dL (ref <200)
HDL: 49 mg/dL — ABNORMAL LOW (ref >=50)
LDL Cholesterol (Calc): 100 mg/dL — ABNORMAL HIGH
Non-HDL Cholesterol (Calc): 114 mg/dL (ref <130)
Total CHOL/HDL Ratio: 3.3 (calc) (ref <5.0)
Triglycerides: 49 mg/dL (ref <150)

## 2023-06-11 ENCOUNTER — Other Ambulatory Visit: Payer: Self-pay | Admitting: Family Medicine

## 2023-06-11 DIAGNOSIS — Z1231 Encounter for screening mammogram for malignant neoplasm of breast: Secondary | ICD-10-CM

## 2023-06-15 ENCOUNTER — Ambulatory Visit: Payer: Self-pay

## 2023-06-15 NOTE — Telephone Encounter (Signed)
   FYI Only or Action Required?: FYI only for provider  Patient was last seen in primary care on 11/02/2022 by Austine Lefort, MD. Called Nurse Triage reporting Urinary Frequency. Symptoms began a week ago. Interventions attempted: Nothing. Symptoms are: gradually worsening.  Triage Disposition: No disposition on file.  Patient/caregiver understands and will follow disposition?:  Copied from CRM [id number]. Topic: Clinical - Pink Word Triage >> Jun 15, 2023  8:06 AM Rosamond Comes wrote: Reason for Triage: patient calling in, staring to burn when urinating and going frequently  Patient phone [phone] Reason for Disposition  Urinating more frequently than usual (i.e., frequency)  Answer Assessment - Initial Assessment Questions 1. SYMPTOM: "What's the main symptom you're concerned about?" (e.g., frequency, incontinence)     frequency 2. ONSET: "When did the  frequncy  start?"     Last week 3. PAIN: "Is there any pain?" If Yes, ask: "How bad is it?" (Scale: 1-10; mild, moderate, severe)     Burning 1/10 4. CAUSE: "What do you think is causing the symptoms?"     UTI 5. OTHER SYMPTOMS: "Do you have any other symptoms?" (e.g., blood in urine, fever, flank pain, pain with urination)     no 6. PREGNANCY: "Is there any chance you are pregnant?" "When was your last menstrual period?"     na  Protocols used: Urinary Symptoms-A-AH

## 2023-06-15 NOTE — Telephone Encounter (Signed)
 This RN made first attempt to triage patient. No answer, LVM. Routing for additional attempts.   Copied From CRM [id number]. Reason for Triage: patient calling in, staring to burn when urinating and going frequently  Patient phone [phone]

## 2023-06-16 ENCOUNTER — Ambulatory Visit: Payer: Self-pay | Admitting: Family Medicine

## 2023-06-16 VITALS — BP 122/80 | HR 79 | Temp 97.9°F | Ht 65.0 in | Wt 154.2 lb

## 2023-06-16 DIAGNOSIS — R300 Dysuria: Secondary | ICD-10-CM

## 2023-06-16 LAB — URINALYSIS, ROUTINE W REFLEX MICROSCOPIC
Bilirubin Urine: NEGATIVE
Glucose, UA: NEGATIVE
Hgb urine dipstick: NEGATIVE
Ketones, ur: NEGATIVE
Leukocytes,Ua: NEGATIVE
Nitrite: NEGATIVE
Protein, ur: NEGATIVE
Specific Gravity, Urine: 1.015 (ref 1.001–1.035)
pH: 6.5 (ref 5.0–8.0)

## 2023-06-16 MED ORDER — NITROFURANTOIN MONOHYD MACRO 100 MG PO CAPS: 100.0000 mg | ORAL_CAPSULE | Freq: Two times a day (BID) | ORAL | 0 refills | Status: AC

## 2023-06-16 NOTE — Progress Notes (Signed)
 Patient Office Visit  Assessment & Plan:   Dysuria -     Urinalysis, Routine w reflex microscopic -     Urine Culture; Future -     Nitrofurantoin Monohyd Macro; Take 1 capsule (100 mg total) by mouth 2 (two) times daily for 7 days.  Dispense: 14 capsule; Refill: 0   Assessment and Plan    Urinary Tract Infection (UTI) Symptoms suggestive of UTI with improvement from cranberry juice and ibuprofen . Urinalysis negative for infection markers. Postmenopausal status may contribute. No severe symptoms or frequent UTIs. - Order urine culture. - Prescribe Macrobid to start if symptoms worsen before culture results. - Discussed Pyridium; declined due to mild symptoms. - Advise monitoring symptoms and starting antibiotics if worsened before Friday. - Review culture results by Friday and adjust treatment based on sensitivity.           Return if symptoms worsen or fail to improve.   Subjective:     Patient ID: Marie Wagner, female    DOB: 1965-05-10  Age: 58 y.o. MRN: [id number]  Chief Complaint  Patient presents with   Dysuria    X 1 week.    Dysuria    Discussed the use of AI scribe software for clinical note transcription with the patient, who gave verbal consent to proceed.  History of Present Illness        Marie Wagner is a 58 year old female who presents with urinary symptoms.  She has been experiencing urinary symptoms since the middle of last week, characterized by burning and pain. Cranberry juice has provided some relief, and ibuprofen  has alleviated the pain. No history of frequent urinary tract infections or bladder issues. No fever, chills, nausea, or vomiting. No history of kidney stones. Her symptoms have become more frequent recently and may be related to sexual activity.  She drinks approximately 64 ounces of water daily, although she does not always finish her container. Her urine is described as yellowish in color. She has experienced similar  symptoms in the past, but previous evaluations did not confirm a urinary tract infection. Currently, her symptoms are improving, but she is experiencing increased frequency of urination at night, getting up two to three times compared to her usual once per night.  She is postmenopausal, having experienced symptoms for about ten years. 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 Physical Exam Results LABS Urinalysis: Negative glucose, negative blood, no protein, negative nitrite, negative leukocytes (06/16/2023) Assessment & Plan Urinary Tract Infection (UTI) Symptoms suggestive of UTI with improvement from cranberry juice and ibuprofen . Urinalysis negative for infection markers. Postmenopausal status may contribute. No severe symptoms or frequent UTIs. - Order urine culture. - Prescribe Macrobid to start if symptoms worsen before culture results. - Discussed Pyridium; declined due to mild symptoms. - Advise monitoring symptoms and starting antibiotics if worsened before Friday. - Review culture results by Friday and adjust treatment based on sensitivity.    The 10-year ASCVD risk score (Arnett DK, et al., 2019) is: 3.4%  Past Medical History:  Diagnosis Date   Anemia    in past   Complication of anesthesia    per pt, Hard to wake up past sedation.   Hemorrhoids    hx of   History of menopause    going thru symptoms of menopause now   History of palpitations    due to stress/over 5 years   HLD (hyperlipidemia)    Post-operative nausea and vomiting  Past Surgical History:  Procedure Laterality Date   TOTAL ABDOMINAL HYSTERECTOMY     Social History   Tobacco Use   Smoking status: Never   Smokeless tobacco: Never  Vaping Use   Vaping status: Never Used  Substance Use Topics   Alcohol use: No   Drug use: No   Family History  Problem Relation Age of Onset   Cancer Mother        melanoma, bone cancer   Cancer Sister 59       Breast  Cancer   Breast cancer Sister 109   Kidney disease Brother    Colon cancer Neg Hx    Colon polyps Neg Hx    Esophageal cancer Neg Hx    Rectal cancer Neg Hx    Stomach cancer Neg Hx    No Known Allergies  Review of Systems  Genitourinary:  Positive for dysuria.      Objective:    BP 122/80   Pulse 79   Temp 97.9 F (36.6 C)   Ht 5' 5 (1.651 m)   Wt 154 lb 4 oz (70 kg)   SpO2 99%   BMI 25.67 kg/m  BP Readings from Last 3 Encounters:  06/16/23 122/80  11/02/22 120/76  07/29/22 101/66   Wt Readings from Last 3 Encounters:  06/16/23 154 lb 4 oz (70 kg)  11/02/22 151 lb 9.6 oz (68.8 kg)  07/29/22 157 lb (71.2 kg)    Physical Exam Vitals and nursing note reviewed.  Constitutional:      Appearance: Normal appearance.  HENT:     Head: Normocephalic.     Right Ear: Tympanic membrane, ear canal and external ear normal.     Left Ear: Tympanic membrane, ear canal and external ear normal.  Eyes:     Extraocular Movements: Extraocular movements intact.     Pupils: Pupils are equal, round, and reactive to light.  Cardiovascular:     Rate and Rhythm: Normal rate and regular rhythm.     Heart sounds: Normal heart sounds.  Pulmonary:     Effort: Pulmonary effort is normal.     Breath sounds: Normal breath sounds.  Abdominal:     Tenderness: There is no abdominal tenderness. There is no right CVA tenderness, left CVA tenderness, guarding or rebound.  Neurological:     General: No focal deficit present.     Mental Status: She is alert.  Psychiatric:        Mood and Affect: Mood normal.        Behavior: Behavior normal.      Results for orders placed or performed in visit on 06/16/23  Urinalysis, Routine w reflex microscopic  Result Value Ref Range   Color, Urine YELLOW YELLOW   APPearance CLEAR CLEAR   Specific Gravity, Urine 1.015 1.001 - 1.035   pH 6.5 5.0 - 8.0   Glucose, UA NEGATIVE NEGATIVE   Bilirubin Urine NEGATIVE NEGATIVE   Ketones, ur NEGATIVE  NEGATIVE   Hgb urine dipstick NEGATIVE NEGATIVE   Protein, ur NEGATIVE NEGATIVE   Nitrite NEGATIVE NEGATIVE   Leukocytes,Ua NEGATIVE NEGATIVE

## 2023-06-22 ENCOUNTER — Encounter: Payer: Self-pay | Admitting: Family Medicine

## 2023-06-22 NOTE — Telephone Encounter (Signed)
 Spoke with Marie Wagner in lab and order was somehow futured and she didn't see it to send the culture. Pt is currently on Macrobid . Do you want me to just see how she is doing on the Macrobid ? Thank you.    Copied from CRM [id number]. Topic: Clinical - Lab/Test Results >> Jun 22, 2023  9:18 AM Alica Antu wrote: Reason for CRM:  Patient called in regarding lab results for urine analysis on  06/16/23.   May you please assist

## 2023-07-01 DIAGNOSIS — Z1231 Encounter for screening mammogram for malignant neoplasm of breast: Secondary | ICD-10-CM

## 2023-07-06 ENCOUNTER — Ambulatory Visit
Admission: RE | Admit: 2023-07-06 | Discharge: 2023-07-06 | Disposition: A | Discharge status: Home / Self Care | Payer: Self-pay | Source: Ambulatory Visit

## 2023-07-06 DIAGNOSIS — Z1231 Encounter for screening mammogram for malignant neoplasm of breast: Secondary | ICD-10-CM

## 2023-07-20 ENCOUNTER — Encounter: Payer: Self-pay | Admitting: Family Medicine

## 2023-07-20 ENCOUNTER — Ambulatory Visit: Payer: Self-pay | Admitting: Family Medicine

## 2023-07-20 VITALS — BP 120/62 | HR 65 | Temp 98.6°F | Ht 65.0 in | Wt 153.4 lb

## 2023-07-20 DIAGNOSIS — Z0001 Encounter for general adult medical examination with abnormal findings: Secondary | ICD-10-CM | POA: Diagnosis not present

## 2023-07-20 DIAGNOSIS — Z0000 Encounter for general adult medical examination without abnormal findings: Secondary | ICD-10-CM

## 2023-07-20 DIAGNOSIS — E559 Vitamin D deficiency, unspecified: Secondary | ICD-10-CM | POA: Diagnosis not present

## 2023-07-20 NOTE — Progress Notes (Signed)
 Subjective:    Patient ID: Marie Wagner, female    DOB: 04-09-1965, 58 y.o.   MRN: [id number]  HPI Patient is a very pleasant 58 year old African-American female who presents today for her complete physical exam.  The patient's last tetanus shot was in 2013.  This is due.  She is also due for the pneumonia vaccine.  The patient is going on a trip so she declines these today.  She received the shingles vaccine at an outside pharmacy.  Her last colonoscopy was in 2024.  The patient did have a tubular adenoma.  I recommend a repeat colonoscopy in 2031.  The patient had a mammogram July 1 this was normal.  She has a history of a hysterectomy and therefore does not require a Pap smear.  She does complain of hot flashes related to menopause.  She is hesitant to take any hormone replacement therapy Immunization History  Administered Date(s) Administered   Hepatitis A 02/22/2012   Hepatitis B 02/22/2012   Influenza,inj,Quad PF,6+ Mos 09/08/2019   Influenza-Unspecified 10/06/2022   PFIZER(Purple Top)SARS-COV-2 Vaccination 03/06/2019, 03/26/2019   PPD Test 10/12/2016   Td 01/14/2011   Tdap 01/14/2011    Past Medical History:  Diagnosis Date   Anemia    in past   Complication of anesthesia    per pt, Hard to wake up past sedation.   Hemorrhoids    hx of   History of menopause    going thru symptoms of menopause now   History of palpitations    due to stress/over 5 years   HLD (hyperlipidemia)    Post-operative nausea and vomiting     Past Surgical History:  Procedure Laterality Date   TOTAL ABDOMINAL HYSTERECTOMY     Current Outpatient Medications on File Prior to Visit  Medication Sig Dispense Refill   cetirizine  (ZYRTEC ) 10 MG tablet Take 1 tablet (10 mg total) by mouth daily. 30 tablet 11   Cholecalciferol  (NAT-RUL VITAMIN D ) 5000 units TABS Take 1 tablet (5,000 Units total) by mouth daily. 30 tablet 2   IBUPROFEN  PO Take by mouth as needed.     No current  facility-administered medications on file prior to visit.   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   She has been on summer break and started to walk/run almost every day at [place].   Hopes to continue exercise even once the school year starts back.   Has 4 children who are grown   Social Drivers of Health   Financial Resource Strain: Low Risk  (07/16/2023)   Overall Financial Resource Strain (CARDIA)    Difficulty of Paying Living Expenses: Not hard at all  Food Insecurity: No Food Insecurity (07/16/2023)   Hunger Vital Sign    Worried About Running  Out of Food in the Last Year: Never true    Ran Out of Food in the Last Year: Never true  Transportation Needs: No Transportation Needs (07/16/2023)   PRAPARE - Administrator, Civil Service (Medical): No    Lack of Transportation (Non-Medical): No  Physical Activity: Sufficiently Active (07/16/2023)   Exercise Vital Sign    Days of Exercise per Week: 3 days    Minutes of Exercise per Session: 150+ min  Recent Concern: Physical Activity - Insufficiently Active (06/16/2023)   Exercise Vital Sign    Days of Exercise per Week: 3 days    Minutes of Exercise per Session: 30 min  Stress: No Stress Concern Present (07/16/2023)   [employer] of Occupational Health - Occupational Stress Questionnaire    Feeling of  Stress: Not at all  Social Connections: Socially Integrated (07/16/2023)   Social Connection and Isolation Panel    Frequency of Communication with Friends and Family: More than three times a week    Frequency of Social Gatherings with Friends and Family: Patient declined    Attends Religious Services: More than 4 times per year    Active Member of [employer] or Organizations: Yes    Attends Engineer, structural: More than 4 times per year    Marital Status: Married  Catering manager Violence: Not on file   Family History  Problem Relation Age of Onset   Cancer Mother        melanoma, bone cancer   Breast cancer Sister 35   Kidney disease Brother    Colon cancer Neg Hx    Colon polyps Neg Hx    Esophageal cancer Neg Hx    Rectal cancer Neg Hx    Stomach cancer Neg Hx      Review of Systems  All other systems reviewed and are negative.      Objective:   Physical Exam Vitals reviewed.  Constitutional:      General: She is not in acute distress.    Appearance: Normal appearance. She is normal weight. She is not ill-appearing or toxic-appearing.  HENT:     Head: Normocephalic and atraumatic.     Right Ear: Tympanic membrane and ear canal normal. There is no impacted cerumen.     Left Ear: Tympanic membrane and ear canal normal. There is no impacted cerumen.     Nose: Nose normal. No congestion or rhinorrhea.     Mouth/Throat:     Mouth: Mucous membranes are moist.     Pharynx: Oropharynx is clear. No oropharyngeal exudate or posterior oropharyngeal erythema.  Eyes:     Extraocular Movements: Extraocular movements intact.     Conjunctiva/sclera: Conjunctivae normal.     Pupils: Pupils are equal, round, and reactive to light.  Neck:     Vascular: No carotid bruit.  Cardiovascular:     Rate and Rhythm: Normal rate and regular rhythm.     Pulses: Normal pulses.     Heart sounds: Normal heart sounds. No murmur heard.    No friction rub. No gallop.  Pulmonary:      Effort: Pulmonary effort is normal. No respiratory distress.     Breath sounds: Normal breath sounds. No stridor. No wheezing, rhonchi or rales.  Chest:     Chest wall: No tenderness.  Abdominal:     General: Bowel sounds are normal. There is no distension.     Palpations: Abdomen is soft. There is no mass.  Tenderness: There is no abdominal tenderness. There is no guarding or rebound.  Musculoskeletal:     Cervical back: Normal range of motion and neck supple.     Right lower leg: No edema.     Left lower leg: No edema.  Lymphadenopathy:     Cervical: No cervical adenopathy.  Skin:    Coloration: Skin is not jaundiced or pale.     Findings: No bruising, erythema, lesion or rash.  Neurological:     Mental Status: She is alert and oriented to person, place, and time. Mental status is at baseline.     Cranial Nerves: No cranial nerve deficit.     Sensory: No sensory deficit.     Motor: No weakness.     Coordination: Coordination normal.     Gait: Gait normal.     Deep Tendon Reflexes: Reflexes normal.  Psychiatric:        Mood and Affect: Mood normal.        Behavior: Behavior normal.        Thought Content: Thought content normal.        Judgment: Judgment normal.           Assessment & Plan:  General medical exam - Plan: CBC with Differential/Platelet, Comprehensive metabolic panel with GFR, Lipid panel  Vitamin D  deficiency - Plan: VITAMIN D  25 Hydroxy (Vit-D Deficiency, Fractures) Physical exam today is completely normal.  Blood pressure is outstanding.  Colon cancer and breast cancer screening are up-to-date.  I recommended a tetanus shot as well as Pneumovax 23 at the patient's earliest convenience.  We discussed veozah for hot flashes.  Patient declines this today.  Check CBC CMP lipid panel and vitamin D  level.

## 2023-07-21 LAB — CBC WITH DIFFERENTIAL/PLATELET
Absolute Lymphocytes: 1675 {cells}/uL (ref 850–3900)
Absolute Monocytes: 331 {cells}/uL (ref 200–950)
Basophils Absolute: 19 {cells}/uL (ref 0–200)
Basophils Relative: 0.4 %
Eosinophils Absolute: 62 {cells}/uL (ref 15–500)
Eosinophils Relative: 1.3 %
HCT: 40.8 % (ref 35.0–45.0)
Hemoglobin: 13.3 g/dL (ref 11.7–15.5)
MCH: 27.5 pg (ref 27.0–33.0)
MCHC: 32.6 g/dL (ref 32.0–36.0)
MCV: 84.5 fL (ref 80.0–100.0)
MPV: 10 fL (ref 7.5–12.5)
Monocytes Relative: 6.9 %
Neutro Abs: 2712 {cells}/uL (ref 1500–7800)
Neutrophils Relative %: 56.5 %
Platelets: 200 Thousand/uL (ref 140–400)
RBC: 4.83 Million/uL (ref 3.80–5.10)
RDW: 14.2 % (ref 11.0–15.0)
Total Lymphocyte: 34.9 %
WBC: 4.8 Thousand/uL (ref 3.8–10.8)

## 2023-07-21 LAB — COMPREHENSIVE METABOLIC PANEL WITH GFR
AG Ratio: 1.6 (calc) (ref 1.0–2.5)
ALT: 18 U/L (ref 6–29)
AST: 15 U/L (ref 10–35)
Albumin: 4.9 g/dL (ref 3.6–5.1)
Alkaline phosphatase (APISO): 55 U/L (ref 37–153)
BUN: 11 mg/dL (ref 7–25)
CO2: 30 mmol/L (ref 20–32)
Calcium: 9.7 mg/dL (ref 8.6–10.4)
Chloride: 103 mmol/L (ref 98–110)
Creat: 0.8 mg/dL (ref 0.50–1.03)
Globulin: 3 g/dL (ref 1.9–3.7)
Glucose, Bld: 92 mg/dL (ref 65–99)
Potassium: 4.9 mmol/L (ref 3.5–5.3)
Sodium: 140 mmol/L (ref 135–146)
Total Bilirubin: 0.3 mg/dL (ref 0.2–1.2)
Total Protein: 7.9 g/dL (ref 6.1–8.1)
eGFR: 85 mL/min/1.73m2 (ref >=60)

## 2023-07-21 LAB — LIPID PANEL
Cholesterol: 185 mg/dL (ref <200)
HDL: 46 mg/dL — ABNORMAL LOW (ref >=50)
LDL Cholesterol (Calc): 119 mg/dL — ABNORMAL HIGH
Non-HDL Cholesterol (Calc): 139 mg/dL — ABNORMAL HIGH (ref <130)
Total CHOL/HDL Ratio: 4 (calc) (ref <5.0)
Triglycerides: 95 mg/dL (ref <150)

## 2023-07-21 LAB — VITAMIN D 25 HYDROXY (VIT D DEFICIENCY, FRACTURES): Vit D, 25-Hydroxy: 37 ng/mL (ref 30–100)

## 2023-07-22 ENCOUNTER — Ambulatory Visit: Payer: Self-pay | Admitting: Family Medicine

## 2023-12-17 ENCOUNTER — Ambulatory Visit: Admitting: Family Medicine

## 2023-12-17 ENCOUNTER — Encounter: Payer: Self-pay | Admitting: Family Medicine

## 2023-12-17 VITALS — BP 120/68 | HR 76 | Temp 98.1°F | Ht 65.0 in | Wt 156.0 lb

## 2023-12-17 DIAGNOSIS — L723 Sebaceous cyst: Secondary | ICD-10-CM

## 2023-12-17 NOTE — Progress Notes (Signed)
 Subjective:    Patient ID: Marie Wagner, female    DOB: [birth year], 58 y.o.   MRN: [id number]  HPI Patient has a tender inflamed sebaceous cyst in the center of her neck roughly around the level of C7.  She is requesting excision of this today.  Cyst is approximately 1 cm in diameter Past Medical History:  Diagnosis Date   Anemia    in past   Complication of anesthesia    per pt, Hard to wake up past sedation.   Hemorrhoids    hx of   History of menopause    going thru symptoms of menopause now   History of palpitations    due to stress/over 5 years   HLD (hyperlipidemia)    Post-operative nausea and vomiting     Past Surgical History:  Procedure Laterality Date   TOTAL ABDOMINAL HYSTERECTOMY     Current Outpatient Medications on File Prior to Visit  Medication Sig Dispense Refill   cetirizine  (ZYRTEC ) 10 MG tablet Take 1 tablet (10 mg total) by mouth daily. 30 tablet 11   Cholecalciferol  (NAT-RUL VITAMIN D ) 5000 units TABS Take 1 tablet (5,000 Units total) by mouth daily. 30 tablet 2   IBUPROFEN  PO Take by mouth as needed.     No current facility-administered medications on file prior to visit.   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   She has been on summer break and started to walk/run almost every day at country [place].   Hopes to continue exercise even once  the school year starts back.   Has 4 children who are grown   Social Drivers of Health   Tobacco Use: Low Risk (12/17/2023)   Patient History    Smoking Tobacco Use: Never    Smokeless Tobacco Use: Never    Passive Exposure: Not on file  Financial Resource Strain: Low Risk (07/16/2023)   Overall Financial Resource Strain (CARDIA)    Difficulty of Paying Living Expenses: Not hard at all  Food Insecurity: No Food Insecurity (07/16/2023)   Epic    Worried About Programme Researcher, Broadcasting/film/video in the Last Year: Never true    Ran Out of Food in the Last Year: Never true  Transportation Needs: No Transportation Needs (07/16/2023)   Epic    Lack of Transportation (Medical): No    Lack of Transportation (Non-Medical): No  Physical Activity: Sufficiently Active (07/16/2023)   Exercise Vital Sign    Days of Exercise per Week: 3 days    Minutes of Exercise per Session: 150+ min  Recent Concern: Physical Activity - Insufficiently Active (06/16/2023)   Exercise Vital Sign    Days of Exercise per Week: 3 days    Minutes of Exercise per Session: 30 min  Stress: No  Stress Concern Present (07/16/2023)   [employer] of Occupational Health - Occupational Stress Questionnaire    Feeling of Stress: Not at all  Social Connections: Socially Integrated (07/16/2023)   Social Connection and Isolation Panel    Frequency of Communication with Friends and Family: More than three times a week    Frequency of Social Gatherings with Friends and Family: Patient declined    Attends Religious Services: More than 4 times per year    Active Member of [employer] or Organizations: Yes    Attends Engineer, Structural: More than 4 times per year    Marital Status: Married  Catering Manager Violence: Not on file  Depression (PHQ2-9): Low Risk (07/20/2023)   Depression (PHQ2-9)    PHQ-2 Score: 0  Alcohol Screen: Not on file  Housing: Unknown (07/16/2023)   Epic    Unable to Pay for Housing in the Last Year: No    Number  of Times Moved in the Last Year: Not on file    Homeless in the Last Year: No  Utilities: Not on file  Health Literacy: Not on file   Family History  Problem Relation Age of Onset   Cancer Mother        melanoma, bone cancer   Breast cancer Sister 32   Kidney disease Brother    Colon cancer Neg Hx    Colon polyps Neg Hx    Esophageal cancer Neg Hx    Rectal cancer Neg Hx    Stomach cancer Neg Hx      Review of Systems  All other systems reviewed and are negative.      Objective:   Physical Exam Vitals reviewed.  Constitutional:      General: She is not in acute distress.    Appearance: Normal appearance. She is normal weight. She is not ill-appearing or toxic-appearing.  HENT:     Head: Normocephalic and atraumatic.  Neck:     Vascular: No carotid bruit.   Cardiovascular:     Rate and Rhythm: Normal rate and regular rhythm.     Pulses: Normal pulses.     Heart sounds: Normal heart sounds. No murmur heard.    No friction rub. No gallop.  Pulmonary:     Effort: Pulmonary effort is normal. No respiratory distress.     Breath sounds: Normal breath sounds. No stridor. No wheezing, rhonchi or rales.  Chest:     Chest wall: No tenderness.  Abdominal:     General: Bowel sounds are normal. There is no distension.     Palpations: Abdomen is soft. There is no mass.     Tenderness: There is no abdominal tenderness. There is no guarding or rebound.  Musculoskeletal:     Right lower leg: No edema.     Left lower leg: No edema.  Skin:    Findings: No rash.  Neurological:     Mental Status: She is alert and oriented to person, place, and time. Mental status is at baseline.     Cranial Nerves: No cranial nerve deficit.     Coordination: Coordination normal.     1 cm in diameter inflamed sebaceous cyst at the level of C7 on her posterior neck      Assessment & Plan:  Inflamed sebaceous cyst I offered the patient excision today and she agrees.  I anesthetized her  neck with 0.1% lidocaine with epinephrine.  I then prepped and draped the patient in sterile fashion.  I performed a  2 cm x 2 cm elliptical excision all the way around the cyst and remove the cyst in its entirety down to the underlying subcutaneous fascia.  I then closed the skin edges with 3 simple interrupted 3-0 Ethilon sutures.  There was minimal blood loss.  Return in 1 week for suture removal.

## 2023-12-24 ENCOUNTER — Ambulatory Visit

## 2023-12-24 DIAGNOSIS — L723 Sebaceous cyst: Secondary | ICD-10-CM

## 2023-12-24 NOTE — Progress Notes (Signed)
 Patient is in office today for a nurse visit for suture removalCompletely removed 3 sutures from area at back of neck. Pt tolerated well. Mjp,lpn
